# Patient Record
Sex: Female | Born: 1945 | Race: White | Hispanic: No | Marital: Married | Smoking: Never smoker
Health system: Southern US, Community
[De-identification: ages and names within clinical notes are randomized; demographics above are authoritative.]

## PROBLEM LIST (undated history)

## (undated) DIAGNOSIS — R519 Headache, unspecified: Secondary | ICD-10-CM

## (undated) DIAGNOSIS — E119 Type 2 diabetes mellitus without complications: Secondary | ICD-10-CM

## (undated) DIAGNOSIS — Z8719 Personal history of other diseases of the digestive system: Secondary | ICD-10-CM

## (undated) DIAGNOSIS — R51 Headache: Secondary | ICD-10-CM

## (undated) DIAGNOSIS — R1083 Colic: Secondary | ICD-10-CM

## (undated) DIAGNOSIS — K8020 Calculus of gallbladder without cholecystitis without obstruction: Secondary | ICD-10-CM

## (undated) HISTORY — PX: APPENDECTOMY: SHX54

## (undated) SURGERY — Surgical Case
Anesthesia: *Unknown

---

## 2013-09-20 DIAGNOSIS — R51 Headache: Secondary | ICD-10-CM | POA: Diagnosis not present

## 2013-09-20 DIAGNOSIS — A6004 Herpesviral vulvovaginitis: Secondary | ICD-10-CM | POA: Diagnosis not present

## 2013-09-20 DIAGNOSIS — N760 Acute vaginitis: Secondary | ICD-10-CM | POA: Diagnosis not present

## 2013-09-20 DIAGNOSIS — E119 Type 2 diabetes mellitus without complications: Secondary | ICD-10-CM | POA: Diagnosis not present

## 2013-10-04 DIAGNOSIS — IMO0001 Reserved for inherently not codable concepts without codable children: Secondary | ICD-10-CM | POA: Diagnosis not present

## 2013-10-14 DIAGNOSIS — N760 Acute vaginitis: Secondary | ICD-10-CM | POA: Diagnosis not present

## 2013-10-14 DIAGNOSIS — Z124 Encounter for screening for malignant neoplasm of cervix: Secondary | ICD-10-CM | POA: Diagnosis not present

## 2013-10-14 DIAGNOSIS — E119 Type 2 diabetes mellitus without complications: Secondary | ICD-10-CM | POA: Diagnosis not present

## 2013-11-01 DIAGNOSIS — E1149 Type 2 diabetes mellitus with other diabetic neurological complication: Secondary | ICD-10-CM | POA: Diagnosis not present

## 2014-01-03 DIAGNOSIS — Z9181 History of falling: Secondary | ICD-10-CM | POA: Diagnosis not present

## 2014-01-03 DIAGNOSIS — E1140 Type 2 diabetes mellitus with diabetic neuropathy, unspecified: Secondary | ICD-10-CM | POA: Diagnosis not present

## 2014-01-03 DIAGNOSIS — Z1389 Encounter for screening for other disorder: Secondary | ICD-10-CM | POA: Diagnosis not present

## 2014-01-03 DIAGNOSIS — Z6826 Body mass index (BMI) 26.0-26.9, adult: Secondary | ICD-10-CM | POA: Diagnosis not present

## 2014-01-03 DIAGNOSIS — I10 Essential (primary) hypertension: Secondary | ICD-10-CM | POA: Diagnosis not present

## 2014-01-03 DIAGNOSIS — Z1231 Encounter for screening mammogram for malignant neoplasm of breast: Secondary | ICD-10-CM | POA: Diagnosis not present

## 2014-01-03 DIAGNOSIS — E785 Hyperlipidemia, unspecified: Secondary | ICD-10-CM | POA: Diagnosis not present

## 2014-01-13 DIAGNOSIS — E119 Type 2 diabetes mellitus without complications: Secondary | ICD-10-CM | POA: Diagnosis not present

## 2014-01-13 DIAGNOSIS — H524 Presbyopia: Secondary | ICD-10-CM | POA: Diagnosis not present

## 2014-08-24 DIAGNOSIS — J069 Acute upper respiratory infection, unspecified: Secondary | ICD-10-CM | POA: Diagnosis not present

## 2014-08-24 DIAGNOSIS — J209 Acute bronchitis, unspecified: Secondary | ICD-10-CM | POA: Diagnosis not present

## 2014-11-24 DIAGNOSIS — Z9181 History of falling: Secondary | ICD-10-CM | POA: Diagnosis not present

## 2014-11-24 DIAGNOSIS — E1140 Type 2 diabetes mellitus with diabetic neuropathy, unspecified: Secondary | ICD-10-CM | POA: Diagnosis not present

## 2014-11-24 DIAGNOSIS — Z6823 Body mass index (BMI) 23.0-23.9, adult: Secondary | ICD-10-CM | POA: Diagnosis not present

## 2014-11-24 DIAGNOSIS — E785 Hyperlipidemia, unspecified: Secondary | ICD-10-CM | POA: Diagnosis not present

## 2014-11-24 DIAGNOSIS — I10 Essential (primary) hypertension: Secondary | ICD-10-CM | POA: Diagnosis not present

## 2014-11-24 DIAGNOSIS — Z1389 Encounter for screening for other disorder: Secondary | ICD-10-CM | POA: Diagnosis not present

## 2014-11-25 DIAGNOSIS — E1140 Type 2 diabetes mellitus with diabetic neuropathy, unspecified: Secondary | ICD-10-CM | POA: Diagnosis not present

## 2015-05-31 DIAGNOSIS — E785 Hyperlipidemia, unspecified: Secondary | ICD-10-CM | POA: Diagnosis not present

## 2015-05-31 DIAGNOSIS — E1140 Type 2 diabetes mellitus with diabetic neuropathy, unspecified: Secondary | ICD-10-CM | POA: Diagnosis not present

## 2015-05-31 DIAGNOSIS — Z6824 Body mass index (BMI) 24.0-24.9, adult: Secondary | ICD-10-CM | POA: Diagnosis not present

## 2015-05-31 DIAGNOSIS — I10 Essential (primary) hypertension: Secondary | ICD-10-CM | POA: Diagnosis not present

## 2015-06-07 DIAGNOSIS — Z6824 Body mass index (BMI) 24.0-24.9, adult: Secondary | ICD-10-CM | POA: Diagnosis not present

## 2015-06-07 DIAGNOSIS — J101 Influenza due to other identified influenza virus with other respiratory manifestations: Secondary | ICD-10-CM | POA: Diagnosis not present

## 2015-06-07 DIAGNOSIS — J029 Acute pharyngitis, unspecified: Secondary | ICD-10-CM | POA: Diagnosis not present

## 2015-06-29 DIAGNOSIS — E119 Type 2 diabetes mellitus without complications: Secondary | ICD-10-CM | POA: Diagnosis not present

## 2015-06-29 DIAGNOSIS — H2513 Age-related nuclear cataract, bilateral: Secondary | ICD-10-CM | POA: Diagnosis not present

## 2015-07-05 DIAGNOSIS — J309 Allergic rhinitis, unspecified: Secondary | ICD-10-CM | POA: Diagnosis not present

## 2015-07-05 DIAGNOSIS — J208 Acute bronchitis due to other specified organisms: Secondary | ICD-10-CM | POA: Diagnosis not present

## 2015-12-06 DIAGNOSIS — Z9181 History of falling: Secondary | ICD-10-CM | POA: Diagnosis not present

## 2015-12-06 DIAGNOSIS — E785 Hyperlipidemia, unspecified: Secondary | ICD-10-CM | POA: Diagnosis not present

## 2015-12-06 DIAGNOSIS — I10 Essential (primary) hypertension: Secondary | ICD-10-CM | POA: Diagnosis not present

## 2015-12-06 DIAGNOSIS — E1140 Type 2 diabetes mellitus with diabetic neuropathy, unspecified: Secondary | ICD-10-CM | POA: Diagnosis not present

## 2015-12-06 DIAGNOSIS — Z6824 Body mass index (BMI) 24.0-24.9, adult: Secondary | ICD-10-CM | POA: Diagnosis not present

## 2015-12-06 DIAGNOSIS — Z1389 Encounter for screening for other disorder: Secondary | ICD-10-CM | POA: Diagnosis not present

## 2016-01-20 DIAGNOSIS — R1013 Epigastric pain: Secondary | ICD-10-CM | POA: Diagnosis not present

## 2016-01-20 DIAGNOSIS — K8020 Calculus of gallbladder without cholecystitis without obstruction: Secondary | ICD-10-CM | POA: Diagnosis not present

## 2016-04-02 DIAGNOSIS — R1013 Epigastric pain: Secondary | ICD-10-CM | POA: Diagnosis not present

## 2016-04-02 DIAGNOSIS — K3189 Other diseases of stomach and duodenum: Secondary | ICD-10-CM | POA: Diagnosis not present

## 2016-04-02 DIAGNOSIS — R1011 Right upper quadrant pain: Secondary | ICD-10-CM | POA: Diagnosis not present

## 2016-04-02 DIAGNOSIS — R109 Unspecified abdominal pain: Secondary | ICD-10-CM | POA: Diagnosis not present

## 2016-04-02 DIAGNOSIS — K8020 Calculus of gallbladder without cholecystitis without obstruction: Secondary | ICD-10-CM | POA: Diagnosis not present

## 2016-04-03 ENCOUNTER — Inpatient Hospital Stay (HOSPITAL_COMMUNITY)
Admission: AD | Admit: 2016-04-03 | Discharge: 2016-04-05 | DRG: 328 | Disposition: A | Discharge status: Home / Self Care | Payer: Medicare Other | Source: Other Acute Inpatient Hospital | Attending: General Surgery | Admitting: General Surgery

## 2016-04-03 ENCOUNTER — Encounter (HOSPITAL_COMMUNITY)
Admission: AD | Disposition: A | Discharge status: Home / Self Care | Payer: Self-pay | Source: Other Acute Inpatient Hospital

## 2016-04-03 ENCOUNTER — Inpatient Hospital Stay (HOSPITAL_COMMUNITY): Payer: Medicare Other | Admitting: Certified Registered Nurse Anesthetist

## 2016-04-03 ENCOUNTER — Encounter (HOSPITAL_COMMUNITY): Payer: Self-pay | Admitting: *Deleted

## 2016-04-03 DIAGNOSIS — Z9889 Other specified postprocedural states: Secondary | ICD-10-CM

## 2016-04-03 DIAGNOSIS — R112 Nausea with vomiting, unspecified: Secondary | ICD-10-CM | POA: Diagnosis not present

## 2016-04-03 DIAGNOSIS — K449 Diaphragmatic hernia without obstruction or gangrene: Secondary | ICD-10-CM

## 2016-04-03 DIAGNOSIS — K440 Diaphragmatic hernia with obstruction, without gangrene: Secondary | ICD-10-CM | POA: Diagnosis not present

## 2016-04-03 DIAGNOSIS — E119 Type 2 diabetes mellitus without complications: Secondary | ICD-10-CM | POA: Diagnosis not present

## 2016-04-03 HISTORY — DX: Colic: R10.83

## 2016-04-03 HISTORY — PX: LAPAROSCOPIC NISSEN FUNDOPLICATION: SHX1932

## 2016-04-03 HISTORY — DX: Personal history of other diseases of the digestive system: Z87.19

## 2016-04-03 HISTORY — DX: Type 2 diabetes mellitus without complications: E11.9

## 2016-04-03 HISTORY — DX: Headache, unspecified: R51.9

## 2016-04-03 HISTORY — DX: Calculus of gallbladder without cholecystitis without obstruction: K80.20

## 2016-04-03 HISTORY — DX: Headache: R51

## 2016-04-03 HISTORY — PX: HIATAL HERNIA REPAIR: SHX195

## 2016-04-03 LAB — CBC
HCT: 40.6 % (ref 36.0–46.0)
HEMOGLOBIN: 13.2 g/dL (ref 12.0–15.0)
MCH: 29.1 pg (ref 26.0–34.0)
MCHC: 32.5 g/dL (ref 30.0–36.0)
MCV: 89.4 fL (ref 78.0–100.0)
PLATELETS: 240 10*3/uL (ref 150–400)
RBC: 4.54 MIL/uL (ref 3.87–5.11)
RDW: 13.1 % (ref 11.5–15.5)
WBC: 12.2 10*3/uL — ABNORMAL HIGH (ref 4.0–10.5)

## 2016-04-03 LAB — BASIC METABOLIC PANEL
ANION GAP: 8 (ref 5–15)
BUN: 9 mg/dL (ref 6–20)
CALCIUM: 9 mg/dL (ref 8.9–10.3)
CO2: 29 mmol/L (ref 22–32)
CREATININE: 1.13 mg/dL — AB (ref 0.44–1.00)
Chloride: 102 mmol/L (ref 101–111)
GFR calc Af Amer: 56 mL/min — ABNORMAL LOW (ref >60)
GFR, EST NON AFRICAN AMERICAN: 48 mL/min — AB (ref >60)
Glucose, Bld: 223 mg/dL — ABNORMAL HIGH (ref 65–99)
Potassium: 4.4 mmol/L (ref 3.5–5.1)
Sodium: 139 mmol/L (ref 135–145)

## 2016-04-03 LAB — GLUCOSE, CAPILLARY
GLUCOSE-CAPILLARY: 162 mg/dL — AB (ref 65–99)
GLUCOSE-CAPILLARY: 100 mg/dL — AB (ref 65–99)
Glucose-Capillary: 178 mg/dL — ABNORMAL HIGH (ref 65–99)
Glucose-Capillary: 171 mg/dL — ABNORMAL HIGH (ref 65–99)

## 2016-04-03 LAB — MRSA PCR SCREENING: MRSA BY PCR: NEGATIVE

## 2016-04-03 SURGERY — REPAIR, HERNIA, HIATAL, LAPAROSCOPIC
Anesthesia: General | Site: Abdomen

## 2016-04-03 MED ORDER — SUCCINYLCHOLINE 20MG/ML (10ML) SYRINGE FOR MEDFUSION PUMP - OPTIME
INTRAMUSCULAR | Status: DC | PRN
  Administered 2016-04-03: 120 mg via INTRAVENOUS

## 2016-04-03 MED ORDER — SUCCINYLCHOLINE CHLORIDE 200 MG/10ML IV SOSY
PREFILLED_SYRINGE | INTRAVENOUS | Status: AC
  Filled 2016-04-03: qty 20

## 2016-04-03 MED ORDER — ONDANSETRON HCL 4 MG/2ML IJ SOLN
INTRAMUSCULAR | Status: DC | PRN
  Administered 2016-04-03: 4 mg via INTRAVENOUS

## 2016-04-03 MED ORDER — ONDANSETRON HCL 4 MG/2ML IJ SOLN
INTRAMUSCULAR | Status: AC
  Filled 2016-04-03: qty 4

## 2016-04-03 MED ORDER — SUGAMMADEX SODIUM 200 MG/2ML IV SOLN
INTRAVENOUS | Status: AC
  Filled 2016-04-03: qty 2

## 2016-04-03 MED ORDER — DIPHENHYDRAMINE HCL 50 MG/ML IJ SOLN
INTRAMUSCULAR | Status: AC
  Filled 2016-04-03: qty 1

## 2016-04-03 MED ORDER — PHENYLEPHRINE 40 MCG/ML (10ML) SYRINGE FOR IV PUSH (FOR BLOOD PRESSURE SUPPORT)
PREFILLED_SYRINGE | INTRAVENOUS | Status: AC
  Filled 2016-04-03: qty 20

## 2016-04-03 MED ORDER — BUPIVACAINE HCL 0.25 % IJ SOLN
INTRAMUSCULAR | Status: DC | PRN
  Administered 2016-04-03: 7 mL

## 2016-04-03 MED ORDER — ROCURONIUM BROMIDE 100 MG/10ML IV SOLN
INTRAVENOUS | Status: DC | PRN
  Administered 2016-04-03: 40 mg via INTRAVENOUS
  Administered 2016-04-03 (×2): 10 mg via INTRAVENOUS
  Administered 2016-04-03: 20 mg via INTRAVENOUS

## 2016-04-03 MED ORDER — LIDOCAINE 2% (20 MG/ML) 5 ML SYRINGE
INTRAMUSCULAR | Status: AC
  Filled 2016-04-03: qty 15

## 2016-04-03 MED ORDER — PHENYLEPHRINE HCL 10 MG/ML IJ SOLN
INTRAMUSCULAR | Status: DC | PRN
  Administered 2016-04-03: 40 ug via INTRAVENOUS

## 2016-04-03 MED ORDER — CEFAZOLIN SODIUM 1 G IJ SOLR
INTRAMUSCULAR | Status: AC
  Filled 2016-04-03: qty 20

## 2016-04-03 MED ORDER — POTASSIUM CHLORIDE IN NACL 20-0.9 MEQ/L-% IV SOLN
INTRAVENOUS | Status: DC
  Administered 2016-04-03: 100 mL/h via INTRAVENOUS
  Administered 2016-04-04: 22:00:00 via INTRAVENOUS
  Filled 2016-04-03 (×4): qty 1000

## 2016-04-03 MED ORDER — ENOXAPARIN SODIUM 40 MG/0.4ML ~~LOC~~ SOLN: 40.0000 mg | Freq: Every day | SUBCUTANEOUS | Status: DC

## 2016-04-03 MED ORDER — 0.9 % SODIUM CHLORIDE (POUR BTL) OPTIME
TOPICAL | Status: DC | PRN
Start: 2016-04-03 — End: 2016-04-03
  Administered 2016-04-03 (×2): 1000 mL

## 2016-04-03 MED ORDER — ROCURONIUM BROMIDE 50 MG/5ML IV SOSY
PREFILLED_SYRINGE | INTRAVENOUS | Status: AC
  Filled 2016-04-03: qty 25

## 2016-04-03 MED ORDER — ENOXAPARIN SODIUM 40 MG/0.4ML ~~LOC~~ SOLN
40.0000 mg | SUBCUTANEOUS | Status: DC
  Administered 2016-04-04 – 2016-04-05 (×2): 40 mg via SUBCUTANEOUS
  Filled 2016-04-03 (×2): qty 0.4

## 2016-04-03 MED ORDER — DEXAMETHASONE SODIUM PHOSPHATE 10 MG/ML IJ SOLN
INTRAMUSCULAR | Status: AC
  Filled 2016-04-03: qty 1

## 2016-04-03 MED ORDER — LACTATED RINGERS IV SOLN
INTRAVENOUS | Status: DC
  Administered 2016-04-03 (×2): via INTRAVENOUS

## 2016-04-03 MED ORDER — DEXTROSE-NACL 5-0.9 % IV SOLN
INTRAVENOUS | Status: DC
  Administered 2016-04-04 (×2): via INTRAVENOUS

## 2016-04-03 MED ORDER — MORPHINE SULFATE (PF) 2 MG/ML IV SOLN
2.0000 mg | INTRAVENOUS | Status: DC | PRN
  Administered 2016-04-03 (×2): 2 mg via INTRAVENOUS
  Filled 2016-04-03 (×2): qty 1

## 2016-04-03 MED ORDER — LIDOCAINE HCL (CARDIAC) 20 MG/ML IV SOLN
INTRAVENOUS | Status: DC | PRN
  Administered 2016-04-03: 100 mg via INTRAVENOUS

## 2016-04-03 MED ORDER — PROMETHAZINE HCL 25 MG/ML IJ SOLN
12.5000 mg | Freq: Three times a day (TID) | INTRAMUSCULAR | Status: DC | PRN
  Administered 2016-04-03: 12.5 mg via INTRAVENOUS
  Filled 2016-04-03: qty 1

## 2016-04-03 MED ORDER — BUPIVACAINE HCL (PF) 0.25 % IJ SOLN
INTRAMUSCULAR | Status: AC
  Filled 2016-04-03: qty 30

## 2016-04-03 MED ORDER — LABETALOL HCL 5 MG/ML IV SOLN
INTRAVENOUS | Status: DC | PRN
  Administered 2016-04-03: 10 mg via INTRAVENOUS
  Administered 2016-04-03 (×2): 5 mg via INTRAVENOUS

## 2016-04-03 MED ORDER — SODIUM CHLORIDE 0.9 % IR SOLN
Status: DC | PRN
  Administered 2016-04-03: 1000 mL

## 2016-04-03 MED ORDER — HYDROMORPHONE HCL 2 MG/ML IJ SOLN
1.0000 mg | INTRAMUSCULAR | Status: DC | PRN
  Administered 2016-04-03: 2 mg via INTRAVENOUS
  Administered 2016-04-04: 1 mg via INTRAVENOUS
  Filled 2016-04-03 (×2): qty 1

## 2016-04-03 MED ORDER — FENTANYL CITRATE (PF) 100 MCG/2ML IJ SOLN
INTRAMUSCULAR | Status: AC
  Filled 2016-04-03: qty 4

## 2016-04-03 MED ORDER — HYDROMORPHONE HCL 1 MG/ML IJ SOLN: 0.2500 mg | INTRAMUSCULAR | Status: DC | PRN

## 2016-04-03 MED ORDER — PROMETHAZINE HCL 25 MG/ML IJ SOLN: 6.2500 mg | INTRAMUSCULAR | Status: DC | PRN

## 2016-04-03 MED ORDER — ZOLPIDEM TARTRATE 5 MG PO TABS: 5.0000 mg | ORAL_TABLET | Freq: Every evening | ORAL | Status: DC | PRN

## 2016-04-03 MED ORDER — PROPOFOL 10 MG/ML IV BOLUS
INTRAVENOUS | Status: DC | PRN
  Administered 2016-04-03: 150 mg via INTRAVENOUS

## 2016-04-03 MED ORDER — MIDAZOLAM HCL 5 MG/5ML IJ SOLN
INTRAMUSCULAR | Status: DC | PRN
  Administered 2016-04-03: 2 mg via INTRAVENOUS

## 2016-04-03 MED ORDER — PANTOPRAZOLE SODIUM 40 MG IV SOLR
40.0000 mg | Freq: Every day | INTRAVENOUS | Status: DC
  Administered 2016-04-03 – 2016-04-04 (×3): 40 mg via INTRAVENOUS
  Filled 2016-04-03 (×3): qty 40

## 2016-04-03 MED ORDER — MIDAZOLAM HCL 2 MG/2ML IJ SOLN
INTRAMUSCULAR | Status: AC
  Filled 2016-04-03: qty 2

## 2016-04-03 MED ORDER — ONDANSETRON HCL 4 MG/2ML IJ SOLN: 4.0000 mg | Freq: Four times a day (QID) | INTRAMUSCULAR | Status: DC | PRN

## 2016-04-03 MED ORDER — LABETALOL HCL 5 MG/ML IV SOLN
INTRAVENOUS | Status: AC
  Filled 2016-04-03: qty 4

## 2016-04-03 MED ORDER — FENTANYL CITRATE (PF) 100 MCG/2ML IJ SOLN
INTRAMUSCULAR | Status: DC | PRN
  Administered 2016-04-03: 50 ug via INTRAVENOUS
  Administered 2016-04-03: 25 ug via INTRAVENOUS
  Administered 2016-04-03 (×4): 50 ug via INTRAVENOUS
  Administered 2016-04-03: 25 ug via INTRAVENOUS

## 2016-04-03 MED ORDER — CEFAZOLIN SODIUM 1 G IJ SOLR
INTRAMUSCULAR | Status: DC | PRN
  Administered 2016-04-03: 2 g via INTRAMUSCULAR

## 2016-04-03 MED ORDER — SCOPOLAMINE 1 MG/3DAYS TD PT72
MEDICATED_PATCH | TRANSDERMAL | Status: AC
  Filled 2016-04-03: qty 2

## 2016-04-03 MED ORDER — SUGAMMADEX SODIUM 200 MG/2ML IV SOLN
INTRAVENOUS | Status: DC | PRN
  Administered 2016-04-03: 200 mg via INTRAVENOUS

## 2016-04-03 MED ORDER — ONDANSETRON 4 MG PO TBDP: 4.0000 mg | ORAL_TABLET | Freq: Four times a day (QID) | ORAL | Status: DC | PRN

## 2016-04-03 MED ORDER — WHITE PETROLATUM GEL
Status: AC
  Filled 2016-04-03: qty 1

## 2016-04-03 MED ORDER — FENTANYL CITRATE (PF) 100 MCG/2ML IJ SOLN
INTRAMUSCULAR | Status: AC
  Filled 2016-04-03: qty 2

## 2016-04-03 SURGICAL SUPPLY — 48 items
APPLIER CLIP ROT 10 11.4 M/L (STAPLE)
BENZOIN TINCTURE PRP APPL 2/3 (GAUZE/BANDAGES/DRESSINGS) ×3 IMPLANT
CANISTER SUCTION 2500CC (MISCELLANEOUS) ×3 IMPLANT
CLAMP ENDO BABCK 10MM (STAPLE) ×3 IMPLANT
CLIP APPLIE ROT 10 11.4 M/L (STAPLE) IMPLANT
CLOSURE WOUND 1/2 X4 (GAUZE/BANDAGES/DRESSINGS) ×1
COVER SURGICAL LIGHT HANDLE (MISCELLANEOUS) ×3 IMPLANT
DECANTER SPIKE VIAL GLASS SM (MISCELLANEOUS) ×3 IMPLANT
DEVICE SUTURE ENDOST 10MM (ENDOMECHANICALS) ×3 IMPLANT
DEVICE TROCAR PUNCTURE CLOSURE (ENDOMECHANICALS) ×3 IMPLANT
DISSECTOR BLUNT TIP ENDO 5MM (MISCELLANEOUS) IMPLANT
DRAIN PENROSE 1/2X36 STERILE (WOUND CARE) ×3 IMPLANT
DRAPE LAPAROSCOPIC ABDOMINAL (DRAPES) ×6 IMPLANT
ELECT REM PT RETURN 9FT ADLT (ELECTROSURGICAL) ×3
ELECTRODE REM PT RTRN 9FT ADLT (ELECTROSURGICAL) ×1 IMPLANT
GAUZE SPONGE 2X2 8PLY STRL LF (GAUZE/BANDAGES/DRESSINGS) ×1 IMPLANT
GLOVE BIOGEL M 8.0 STRL (GLOVE) ×3 IMPLANT
GOWN STRL REUS W/ TWL LRG LVL3 (GOWN DISPOSABLE) ×1 IMPLANT
GOWN STRL REUS W/ TWL XL LVL3 (GOWN DISPOSABLE) ×1 IMPLANT
GOWN STRL REUS W/TWL LRG LVL3 (GOWN DISPOSABLE) ×2
GOWN STRL REUS W/TWL XL LVL3 (GOWN DISPOSABLE) ×2
KIT BASIN OR (CUSTOM PROCEDURE TRAY) ×3 IMPLANT
MESH HERNIA 7X10 (Mesh General) ×3 IMPLANT
NEEDLE INSUFFLATION 14GA 120MM (NEEDLE) ×3 IMPLANT
NS IRRIG 1000ML POUR BTL (IV SOLUTION) ×3 IMPLANT
PENCIL BUTTON HOLSTER BLD 10FT (ELECTRODE) IMPLANT
SCALPEL HARMONIC ACE (MISCELLANEOUS) ×3 IMPLANT
SCISSORS LAP 5X35 DISP (ENDOMECHANICALS) ×3 IMPLANT
SET IRRIG TUBING LAPAROSCOPIC (IRRIGATION / IRRIGATOR) ×3 IMPLANT
SLEEVE ADV FIXATION 5X100MM (TROCAR) IMPLANT
SPONGE GAUZE 2X2 STER 10/PKG (GAUZE/BANDAGES/DRESSINGS) ×2
STAPLER AUT SUT 4.8 EEAXL 31 (STAPLE) ×3 IMPLANT
STAPLER VISISTAT 35W (STAPLE) ×3 IMPLANT
STRIP CLOSURE SKIN 1/2X4 (GAUZE/BANDAGES/DRESSINGS) ×2 IMPLANT
SUT ETHIBOND CT1 BRD #0 30IN (SUTURE) ×21 IMPLANT
SUT MNCRL AB 4-0 PS2 18 (SUTURE) ×3 IMPLANT
SUT SURGIDAC NAB ES-9 0 48 120 (SUTURE) IMPLANT
TIP INNERVISION DETACH 40FR (MISCELLANEOUS) IMPLANT
TIP INNERVISION DETACH 50FR (MISCELLANEOUS) IMPLANT
TIP INNERVISION DETACH 56FR (MISCELLANEOUS) IMPLANT
TIPS INNERVISION DETACH 40FR (MISCELLANEOUS)
TRAY FOLEY CATH 14FRSI W/METER (CATHETERS) ×3 IMPLANT
TRAY LAPAROSCOPIC MC (CUSTOM PROCEDURE TRAY) ×3 IMPLANT
TROCAR ADV FIXATION 5X100MM (TROCAR) IMPLANT
TROCAR XCEL BLUNT TIP 100MML (ENDOMECHANICALS) IMPLANT
TROCAR XCEL NON-BLD 11X100MML (ENDOMECHANICALS) IMPLANT
TROCAR XCEL NON-BLD 5MMX100MML (ENDOMECHANICALS) IMPLANT
TUBING FILTER THERMOFLATOR (ELECTROSURGICAL) ×3 IMPLANT

## 2016-04-03 NOTE — Progress Notes (Signed)
Inpatient Diabetes Program Recommendations  AACE/ADA: New Consensus Statement on Inpatient Glycemic Control (2015)  Target Ranges:  Prepandial:   less than 140 mg/dL      Peak postprandial:   less than 180 mg/dL (1-2 hours)      Critically ill patients:  140 - 180 mg/dL   Review of Glycemic Control  Diabetes history: DM 2 Outpatient Diabetes medications: Metformin 1000 mg Daily prn Current orders for Inpatient glycemic control: None   Inpatient Diabetes Program Recommendations:   In OR currently. Fasting glucose 100 while NPO this am. When patient returns to the floor, please consider Novolog Sensitive Correction Q 4 hours while inpatient.  Thanks,  Christena DeemShannon  RN, MSN, [facility] Inpatient Diabetes Coordinator Team Pager [phone] (8a-5p)

## 2016-04-03 NOTE — Progress Notes (Signed)
  Subjective: Pt with no c/o this AM No abd pain No n/v  Objective: Vital signs in last 24 hours: Temp:  [98.1 F (36.7 C)-98.6 F (37 C)] 98.2 F (36.8 C) (01/22 0700) Pulse Rate:  [76-94] 94 (01/22 0400) Resp:  [15-31] 22 (01/22 0400) BP: (153-191)/(67-95) 158/73 (01/22 0400) SpO2:  [94 %-97 %] 96 % (01/22 0400) Weight:  [72.7 kg (160 lb 4.4 oz)] 72.7 kg (160 lb 4.4 oz) (01/22 0025) Last BM Date: 04/01/16  Intake/Output from previous day: 01/21 0701 - 01/22 0700 In: 576.7 [I.V.:576.7] Out: 200 [Urine:200] Intake/Output this shift: No intake/output data recorded.  General appearance: alert and cooperative Resp: clear to auscultation bilaterally Cardio: regular rate and rhythm, S1, S2 normal, no murmur, click, rub or gallop GI: soft, non-tender; bowel sounds normal; no masses,  no organomegaly  Lab Results:   Recent Labs  04/03/16 0105  WBC 12.2*  HGB 13.2  HCT 40.6  PLT 240   BMET  Recent Labs  04/03/16 0105  NA 139  K 4.4  CL 102  CO2 29  GLUCOSE 223*  BUN 9  CREATININE 1.13*  CALCIUM 9.0    Assessment/Plan: Type III paraesophageal hernia DM   1. Pt would like to proceed to the OR for Lap hiatal hernia repair with mesh and nissen fundoplication 2. All risks and benefits were discussed with the patient, to generally include infection, bleeding, damage to surrounding structures ie. esophagus, pneumothorax, acute and chronic nerve pain, and recurrence. Alternatives were offered and described.  All questions were answered and the patient voiced understanding of the procedure and wishes to proceed at this point.   LOS: 0 days    Marigene EhlersRamirez Jr., Jed LimerickArmando 04/03/2016

## 2016-04-03 NOTE — Discharge Instructions (Signed)
EATING AFTER YOUR ESOPHAGEAL SURGERY °(Stomach Fundoplication, Hiatal Hernia repair, Achalasia surgery, etc) ° °###################################################################### ° °EAT °Start with a pureed / full liquid diet (see below) °Gradually transition to a high fiber diet with a fiber supplement over the next month after discharge.   ° °WALK °Walk an hour a day.  Control your pain to do that.   ° °CONTROL PAIN °Control pain so that you can walk, sleep, tolerate sneezing/coughing, go up/down stairs. ° °HAVE A BOWEL MOVEMENT DAILY °Keep your bowels regular to avoid problems.  OK to try a laxative to override constipation.  OK to use an antidairrheal to slow down diarrhea.  Call if not better after 2 tries ° °CALL IF YOU HAVE PROBLEMS/CONCERNS °Call if you are still struggling despite following these instructions. °Call if you have concerns not answered by these instructions ° °###################################################################### ° ° °After your esophageal surgery, expect some sticking with swallowing over the next 1-2 months.   ° °If food sticks when you eat, it is called "dysphagia".  This is due to swelling around your esophagus at the wrap & hiatal diaphragm repair.  It will gradually ease off over the next few months.  To help you through this temporary phase, we start you out on a pureed (blenderized) diet. ° °Your first meal in the hospital was thin liquids.  You should have been given a pureed diet by the time you left the hospital.  We ask patients to stay on a pureed diet for the first 2-3 weeks to avoid anything getting "stuck" near your recent surgery.  Don't be alarmed if your ability to swallow doesn't progress according to this plan.  Everyone is different and some diets can advance more or less quickly.   ° ° °Some BASIC RULES to follow are: °· Maintain an upright position whenever eating or drinking. °· Take small bites - just a teaspoon size bite at a time. °· Eat slowly.   It may also help to eat only one food at a time. °· Consider nibbling through smaller, more frequent meals & avoid the urge to eat BIG meals °· Do not push through feelings of fullness, nausea, or bloatedness °· Do not mix solid foods and liquids in the same mouthful °· Try not to "wash foods down" with large gulps of liquids. °· Avoid carbonated (bubbly/fizzy) drinks.   °· Avoid foods that make you feel gassy or bloated.  Start with bland foods first.  Wait on trying greasy, fried, or spicy meals until you are tolerating more bland solids well. °· Understand that it will be hard to burp and belch at first.  This gradually improves with time.  Expect to be more gassy/flatulent/bloated initially.  Walking will help your body manage it better. °· Consider using medications for bloating that contain simethicone such as  Maalox or Gas-X  °· Eat in a relaxed atmosphere & minimize distractions. °· Avoid talking while eating.   °· Do not use straws. °· Following each meal, sit in an upright position (90 degree angle) for 60 to 90 minutes.  Going for a short walk can help as well °· If food does stick, don't panic.  Try to relax and let the food pass on its own.  Sipping WARM LIQUID such as strong hot black tea can also help slide it down. ° ° °Be gradual in changes & use common sense: ° °-If you easily tolerating a certain "level" of foods, advance to the next level gradually °-If you are   having trouble swallowing a particular food, then avoid it.   °-If food is sticking when you advance your diet, go back to thinner previous diet (the lower LEVEL) for 1-2 days. ° °LEVEL 1 = PUREED DIET ° °Do for the first 2 WEEKS AFTER SURGERY ° °-Foods in this group are pureed or blenderized to a smooth, mashed potato-like consistency.  °-If necessary, the pureed foods can keep their shape with the addition of a thickening agent.   °-Meat should be pureed to a smooth, pasty consistency.  Hot broth or gravy may be added to the pureed  meat, approximately 1 oz. of liquid per 3 oz. serving of meat. °-CAUTION:  If any foods do not puree into a smooth consistency, swallowing will be more difficult.  (For example, nuts or seeds sometimes do not blend well.) ° °Hot Foods Cold Foods  °Pureed scrambled eggs and cheese Pureed cottage cheese  °Baby cereals Thickened juices and nectars  °Thinned cooked cereals (no lumps) Thickened milk or eggnog  °Pureed French toast or pancakes Ensure  °Mashed potatoes Ice cream  °Pureed parsley, au gratin, scalloped potatoes, candied sweet potatoes Fruit or Italian ice, sherbet  °Pureed buttered or alfredo noodles Plain yogurt  °Pureed vegetables (no corn or peas) Instant breakfast  °Pureed soups and creamed soups Smooth pudding, mousse, custard  °Pureed scalloped apples Whipped gelatin  °Gravies Sugar, syrup, honey, jelly  °Sauces, cheese, tomato, barbecue, white, creamed Cream  °Any baby food Creamer  °Alcohol in moderation (not beer or champagne) Margarine  °Coffee or tea Mayonnaise  ° Ketchup, mustard  ° Apple sauce  ° °SAMPLE MENU:  PUREED DIET °Breakfast Lunch Dinner  °· Orange juice, 1/2 cup °· Cream of wheat, 1/2 cup · Pineapple juice, 1/2 cup · Pureed turkey, barley soup, 3/4 cup °· Pureed Hawaiian chicken, 3 oz  °· Scrambled eggs, mashed or blended with cheese, 1/2 cup °· Tea or coffee, 1 cup  °· Whole milk, 1 cup  °· Non-dairy creamer, 2 Tbsp. · Mashed potatoes, 1/2 cup °· Pureed cooled broccoli, 1/2 cup °· Apple sauce, 1/2 cup °· Coffee or tea · Mashed potatoes, 1/2 cup °· Pureed spinach, 1/2 cup °· Frozen yogurt, 1/2 cup °· Tea or coffee  ° ° ° ° °LEVEL 2 = SOFT DIET ° °After your first 2 weeks, you can advance to a soft diet.   °Keep on this diet until everything goes down easily. ° °Hot Foods Cold Foods  °White fish Cottage cheese  °Stuffed fish Junior baby fruit  °Baby food meals Semi thickened juices  °Minced soft cooked, scrambled, poached eggs nectars  °Souffle & omelets Ripe mashed bananas  °Cooked  cereals Canned fruit, pineapple sauce, milk  °potatoes Milkshake  °Buttered or Alfredo noodles Custard  °Cooked cooled vegetable Puddings, including tapioca  °Sherbet Yogurt  °Vegetable soup or alphabet soup Fruit ice, Italian ice  °Gravies Whipped gelatin  °Sugar, syrup, honey, jelly Junior baby desserts  °Sauces:  Cheese, creamed, barbecue, tomato, white Cream  °Coffee or tea Margarine  ° °SAMPLE MENU:  LEVEL 2 °Breakfast Lunch Dinner  °· Orange juice, 1/2 cup °· Oatmeal, 1/2 cup °· Scrambled eggs with cheese, 1/2 cup °· Decaffeinated tea, 1 cup °· Whole milk, 1 cup °· Non-dairy creamer, 2 Tbsp · Pineapple juice, 1/2 cup °· Minced beef, 3 oz °· Gravy, 2 Tbsp °· Mashed potatoes, 1/2 cup °· Minced fresh broccoli, 1/2 cup °· Applesauce, 1/2 cup °· Coffee, 1 cup · Turkey, barley soup, 3/4 cup °·   Minced Hawaiian chicken, 3 oz °· Mashed potatoes, 1/2 cup °· Cooked spinach, 1/2 cup °· Frozen yogurt, 1/2 cup °· Non-dairy creamer, 2 Tbsp  ° ° ° ° °LEVEL 3 = CHOPPED DIET ° °-After all the foods in level 2 (soft diet) are passing through well you should advance up to more chopped foods.  °-It is still important to cut these foods into small pieces and eat slowly. ° °Hot Foods Cold Foods  °Poultry Cottage cheese  °Chopped Swedish meatballs Yogurt  °Meat salads (ground or flaked meat) Milk  °Flaked fish (tuna) Milkshakes  °Poached or scrambled eggs Soft, cold, dry cereal  °Souffles and omelets Fruit juices or nectars  °Cooked cereals Chopped canned fruit  °Chopped French toast or pancakes Canned fruit cocktail  °Noodles or pasta (no rice) Pudding, mousse, custard  °Cooked vegetables (no frozen peas, corn, or mixed vegetables) Green salad  °Canned small sweet peas Ice cream  °Creamed soup or vegetable soup Fruit ice, Italian ice  °Pureed vegetable soup or alphabet soup Non-dairy creamer  °Ground scalloped apples Margarine  °Gravies Mayonnaise  °Sauces:  Cheese, creamed, barbecue, tomato, white Ketchup  °Coffee or tea Mustard   ° °SAMPLE MENU:  LEVEL 3 °Breakfast Lunch Dinner  °· Orange juice, 1/2 cup °· Oatmeal, 1/2 cup °· Scrambled eggs with cheese, 1/2 cup °· Decaffeinated tea, 1 cup °· Whole milk, 1 cup °· Non-dairy creamer, 2 Tbsp °· Ketchup, 1 Tbsp °· Margarine, 1 tsp °· Salt, 1/4 tsp °· Sugar, 2 tsp · Pineapple juice, 1/2 cup °· Ground beef, 3 oz °· Gravy, 2 Tbsp °· Mashed potatoes, 1/2 cup °· Cooked spinach, 1/2 cup °· Applesauce, 1/2 cup °· Decaffeinated coffee °· Whole milk °· Non-dairy creamer, 2 Tbsp °· Margarine, 1 tsp °· Salt, 1/4 tsp · Pureed turkey, barley soup, 3/4 cup °· Barbecue chicken, 3 oz °· Mashed potatoes, 1/2 cup °· Ground fresh broccoli, 1/2 cup °· Frozen yogurt, 1/2 cup °· Decaffeinated tea, 1 cup °· Non-dairy creamer, 2 Tbsp °· Margarine, 1 tsp °· Salt, 1/4 tsp °· Sugar, 1 tsp  ° ° °LEVEL 4:  REGULAR FOODS ° °-Foods in this group are soft, moist, regularly textured foods.   °-This level includes meat and breads, which tend to be the hardest things to swallow.   °-Eat very slowly, chew well and continue to avoid carbonated drinks. °-most people are at this level in 4-6 weeks ° °Hot Foods Cold Foods  °Baked fish or skinned Soft cheeses - cottage cheese  °Souffles and omelets Cream cheese  °Eggs Yogurt  °Stuffed shells Milk  °Spaghetti with meat sauce Milkshakes  °Cooked cereal Cold dry cereals (no nuts, dried fruit, coconut)  °French toast or pancakes Crackers  °Buttered toast Fruit juices or nectars  °Noodles or pasta (no rice) Canned fruit  °Potatoes (all types) Ripe bananas  °Soft, cooked vegetables (no corn, lima, or baked beans) Peeled, ripe, fresh fruit  °Creamed soups or vegetable soup Cakes (no nuts, dried fruit, coconut)  °Canned chicken noodle soup Plain doughnuts  °Gravies Ice cream  °Bacon dressing Pudding, mousse, custard  °Sauces:  Cheese, creamed, barbecue, tomato, white Fruit ice, Italian ice, sherbet  °Decaffeinated tea or coffee Whipped gelatin  °Pork chops Regular gelatin  ° Canned fruited  gelatin molds  ° Sugar, syrup, honey, jam, jelly  ° Cream  ° Non-dairy  ° Margarine  ° Oil  ° Mayonnaise  ° Ketchup  ° Mustard  ° °TROUBLESHOOTING IRREGULAR BOWELS  °1) Avoid extremes of bowel   movements (no bad constipation/diarrhea)  °2) Miralax 17gm mixed in 8oz. water or juice-daily. May use BID as needed.  °3) Gas-x,Phazyme, etc. as needed for gas & bloating.  °4) Soft,bland diet. No spicy,greasy,fried foods.  °5) Prilosec over-the-counter as needed  °6) May hold gluten/wheat products from diet to see if symptoms improve.  °7) May try probiotics (Align, Activa, etc) to help calm the bowels down  °7) If symptoms become worse call back immediately. ° ° ° °If you have any questions please call our office at [facility]: [phone]. ° °

## 2016-04-03 NOTE — Progress Notes (Signed)
[facility] After full report received. Pt assisted to hospital bed and introduced to unit and staff. CHG bath done, MRSA swab sent and new hospital gown applied. Pt attached to unit monitor revealing NSR. Dr Lindie SpruceWyatt paged, at bedside with husband plan of care discussed and all questions answered. Visitation guideline reviewed.

## 2016-04-03 NOTE — Transfer of Care (Signed)
Immediate Anesthesia Transfer of Care Note  Patient: Debra Newman  Procedure(s) Performed: Procedure(s): LAPAROSCOPIC REPAIR OF HIATAL HERNIA WITH MESH (N/A) LAPAROSCOPIC NISSEN FUNDOPLICATION (N/A)  Patient Location: PACU  Anesthesia Type:General  Level of Consciousness: sedated  Airway & Oxygen Therapy: Patient Spontanous Breathing and Patient connected to nasal cannula oxygen  Post-op Assessment: Report given to RN and Post -op Vital signs reviewed and stable  Post vital signs: Reviewed and stable  Last Vitals:  Vitals:   04/03/16 0900 04/03/16 1433  BP:  (!) 182/93  Pulse: 73 81  Resp: 15 18  Temp:  (P) 36.6 C    Last Pain:  Vitals:   04/03/16 0700  TempSrc: Oral  PainSc:       Patients Stated Pain Goal: 2 (04/03/16 0400)  Complications: No apparent anesthesia complications

## 2016-04-03 NOTE — Op Note (Signed)
04/03/2016  3:36 PM  PATIENT:  Debra Newman  71 y.o. female  PRE-OPERATIVE DIAGNOSIS:  HIATAL HERNIA  POST-OPERATIVE DIAGNOSIS:  HIATAL HERNIA  PROCEDURE:  Procedure(s): LAPAROSCOPIC REPAIR OF HIATAL HERNIA WITH MESH (N/A) LAPAROSCOPIC NISSEN FUNDOPLICATION (N/A)  SURGEON:  Surgeon(s) and Role:    * Axel Filler, MD - Primary   ASSISTANTS: Myrtie Soman, RNFA   ANESTHESIA:   local and general  EBL: 25cc Total I/O In: 2050 [I.V.:2050] Out: 900 [Urine:900]  BLOOD ADMINISTERED:none  DRAINS: none   LOCAL MEDICATIONS USED:  BUPIVICAINE   SPECIMEN:  No Specimen  DISPOSITION OF SPECIMEN:  N/A  COUNTS:  YES  TOURNIQUET:  * No tourniquets in log *  DICTATION: .Dragon Dictation The patient was taken back to the operating room and placed in the lithotomy position with bilateral SCDs in place. She was prepped and draped in the usual sterile fashion.  A foley catheter was placed.  A timeout was called and off all facts and antibiotics were confirmed.  A Veress needle technique was used to insufflate the abdomen to 14 mm of mercury. This was done the midclavicular line just lateral to the umbilicus. A 5 mm trocar and camera were then placed intra-abdominally.  There was no injury to any intra-abdominal organs. A 5 mm trocar was then placed in the epigastrium and the midline under direct visualization. There were some inferior umbilical midline omental adhesions that were taken down with sharp dissection.  A 5 mm trocar was then placed in the left subcostal margin, 12mm trocar in the left lower quadrant, and a 5mm umbilicus under direct visualization. At this time a Nathanson liver retractor was then placed to retract the liver. At this time it could be seen that there was a large amount of omentum and stomach within the left chest. This was grasped and brought down to the abdomen.   At this time we began to dissect the hernia sac anteriorly. We dissected this circumferentially  around the esophagus. And up into the anterior mediastinum. We proceeded to dissect the left portion of the esophagus and hiatal hernia sac. there were dense adhesions to the left crus and to the short gastrics.  This brought [place] to the left crus. At this point we retracted the esophagus to the left and dissected the right crus, until the left crus could be seen in the most posterior portion.  A latex Penrose drain was then placed around the esophagus to help with retraction.   At this time we continued to dissect the surrounding adventitial tissue around the esophagus cephalad in the mediastinum. This allowed the stomach to lay within the abdomen with undue tension.  We then began to dissect away the short gastrics on the greater curvature of the stomach. This allowed [place] to release some tension of the stomach to allow [place] to dissect the left crus more appropriately.  At this time we were able to visualize the crus had a large defect. 0 Ethibond stitches were then used in an interrupted fashion to reapproximate the hiatus. This allowed the crus to be reapproximated  without strangulation or narrowing of the esophagus. At this time a piece of Gore Bio-A mesh was placed into the abdomen. This was in placed over the suture repair posterior to the esophagus over the hiatal repair and stapled using the Covedien Universal hernia stapler, with a 4.8 mm load. This allowed the mesh to lay flat against the hiatal repair.  At this time the esophagus was retracted inferiorly.  We proceeded to pass the greater curvature of the stomach posterior to the esophagus. A shoeshine technique was performed. At this time the Nissen fundoplication was sutured using 0 Ethibonds in an interrupted standard fashion approximately 1 cm apart approximately 2-3 cm in length. The middle stitch incorporated a thin layer of the esophagus into the wrap. The wrap laid approximately the 10:00 position.  0Ethibonds were used as collar stitches to  secure the wrap to the hiatus. These were done in interrupted fashion.     At this time the area checked for hemostasis which was excellent.  the 12 mm trocar site was reapproximated using an Endo Close and 0 Vicryl interrupted standard fashion.The pneumoperitoneum was evacuated all trochars were removed. All trocar sites were then reapproximated using a 4-0 Monocryl in a subcuticular fashion. the skin was dressed with a LiquiBand.      PLAN OF CARE: Admit to inpatient   PATIENT DISPOSITION:  PACU - hemodynamically stable.   Delay start of Pharmacological VTE agent (>24hrs) due to surgical blood loss or risk of bleeding: not applicable

## 2016-04-03 NOTE — Anesthesia Preprocedure Evaluation (Signed)
Anesthesia Evaluation  Patient identified by MRN, date of birth, ID band Patient awake    Reviewed: Allergy & Precautions, NPO status , Patient's Chart, lab work & pertinent test results  Airway Mallampati: I  TM Distance: >3 FB Neck ROM: Full    Dental  (+) Teeth Intact, Poor Dentition   Pulmonary neg pulmonary ROS,    breath sounds clear to auscultation       Cardiovascular  Rhythm:Regular Rate:Normal     Neuro/Psych    GI/Hepatic Neg liver ROS, Bowel obstruction   Endo/Other  diabetes, Well Controlled, Type 2  Renal/GU negative Renal ROS     Musculoskeletal   Abdominal   Peds  Hematology   Anesthesia Other Findings   Reproductive/Obstetrics                             Anesthesia Physical Anesthesia Plan  ASA: II and emergent  Anesthesia Plan: General   Post-op Pain Management:    Induction: Intravenous  Airway Management Planned: Oral ETT  Additional Equipment:   Intra-op Plan:   Post-operative Plan: Extubation in OR  Informed Consent: I have reviewed the patients History and Physical, chart, labs and discussed the procedure including the risks, benefits and alternatives for the proposed anesthesia with the patient or authorized representative who has indicated his/her understanding and acceptance.   Dental advisory given  Plan Discussed with: CRNA  Anesthesia Plan Comments:         Anesthesia Quick Evaluation

## 2016-04-03 NOTE — H&P (Signed)
Debra Newman is an 71 y.o. female.   Chief Complaint: Nausea, vomiting and mid chest pain HPI: Has had history of hiatal hernia with prior episodes of nausea and vomiting.  Symptoms usually occur postprandially.  Attacks occur sporadically.  This was the worse.  CT done demonstrating paraesophageal h ernia with possible volvulus.  Clinically she does not appear to have a volvulus.  No past medical history on file.  No past surgical history on file.  No family history on file. Social History:  has no tobacco, alcohol, and drug history on file.  Allergies: Allergies not on file  No prescriptions prior to admission.    No results found for this or any previous visit (from the past 48 hour(s)). No results found.  Review of Systems  Constitutional: Negative for chills and fever.  HENT: Negative.   Eyes: Negative.   Respiratory: Negative.   Cardiovascular: Positive for chest pain (Midsternal discomfort).  Gastrointestinal: Positive for heartburn and vomiting.  Genitourinary: Negative.   Musculoskeletal: Negative.   Skin: Negative.   Neurological: Negative.   Endo/Heme/Allergies: Negative.   Psychiatric/Behavioral: Negative.     Blood pressure (!) 166/69, pulse 77, temperature 98.6 F (37 C), temperature source Oral, resp. rate 19, SpO2 96 %. Physical Exam  Constitutional: She is oriented to person, place, and time. She appears well-developed and well-nourished.  HENT:  Head: Normocephalic and atraumatic.  Eyes: Conjunctivae and EOM are normal. Pupils are equal, round, and reactive to light.  Neck: Normal range of motion. Neck supple.  Cardiovascular: Normal rate, regular rhythm and normal heart sounds.   Respiratory: Effort normal and breath sounds normal.  GI: Soft. Bowel sounds are normal. She exhibits no distension. There is no tenderness. There is no rebound and no guarding.    Musculoskeletal: Normal range of motion.  Neurological: She is alert and oriented to person,  place, and time. She has normal reflexes.  Skin: Skin is warm and dry.     Assessment/Plan Symptomatic paraesophageal hernia with intermittent obstruction. CT demonstrates some obstruction of the stomach, but nothing that looks ischemic or strangulated.  Will treat symptomatically, sing out to surgeon of the week who will consider laparoscopic reduction and repair. IV hydration, pain control and anti-nausea medication  Jimmye NormanJAMES , MD 04/03/2016, 12:47 AM

## 2016-04-03 NOTE — Anesthesia Procedure Notes (Signed)
Procedure Name: Intubation Date/Time: 04/03/2016 10:27 AM Performed by: Samara DeistBECKNER,  B Pre-anesthesia Checklist: Patient identified, Emergency Drugs available, Suction available, Patient being monitored and Timeout performed Patient Re-evaluated:Patient Re-evaluated prior to inductionOxygen Delivery Method: Circle system utilized Preoxygenation: Pre-oxygenation with 100% oxygen Intubation Type: IV induction, Rapid sequence and Cricoid Pressure applied Laryngoscope Size: Glidescope and 4 Grade View: Grade I Tube type: Oral Tube size: 7.0 mm Number of attempts: 1 Airway Equipment and Method: Video-laryngoscopy and Stylet Placement Confirmation: ETT inserted through vocal cords under direct vision,  positive ETCO2 and breath sounds checked- equal and bilateral Secured at: 21 cm Tube secured with: Tape Dental Injury: Teeth and Oropharynx as per pre-operative assessment

## 2016-04-04 ENCOUNTER — Encounter (HOSPITAL_COMMUNITY): Payer: Self-pay | Admitting: General Surgery

## 2016-04-04 ENCOUNTER — Inpatient Hospital Stay (HOSPITAL_COMMUNITY): Payer: Medicare Other

## 2016-04-04 LAB — BASIC METABOLIC PANEL
ANION GAP: 7 (ref 5–15)
BUN: 6 mg/dL (ref 6–20)
CHLORIDE: 100 mmol/L — AB (ref 101–111)
CO2: 28 mmol/L (ref 22–32)
Calcium: 8.2 mg/dL — ABNORMAL LOW (ref 8.9–10.3)
Creatinine, Ser: 0.95 mg/dL (ref 0.44–1.00)
GFR, EST NON AFRICAN AMERICAN: 60 mL/min — AB (ref >60)
Glucose, Bld: 165 mg/dL — ABNORMAL HIGH (ref 65–99)
POTASSIUM: 4.6 mmol/L (ref 3.5–5.1)
SODIUM: 135 mmol/L (ref 135–145)

## 2016-04-04 MED ORDER — IOPAMIDOL (ISOVUE-300) INJECTION 61%
INTRAVENOUS | Status: AC
  Administered 2016-04-04: 100 mL via ORAL
  Filled 2016-04-04: qty 150

## 2016-04-04 MED ORDER — HYDROCODONE-ACETAMINOPHEN 7.5-325 MG/15ML PO SOLN
10.0000 mL | ORAL | Status: DC | PRN
  Administered 2016-04-04: 10 mL via ORAL
  Filled 2016-04-04: qty 15

## 2016-04-04 MED ORDER — IOPAMIDOL (ISOVUE-300) INJECTION 61%
150.0000 mL | Freq: Once | INTRAVENOUS | Status: AC | PRN
  Administered 2016-04-04: 100 mL via ORAL

## 2016-04-04 NOTE — Plan of Care (Signed)
Problem: Food- and Nutrition-Related Knowledge Deficit (NB-1.1) Goal: Nutrition education Formal process to instruct or train a patient/client in a skill or to impart knowledge to help patients/clients voluntarily manage or modify food choices and eating behavior to maintain or improve health. Nutrition Education Note  RD consulted for nutrition education regarding nissen fundoplication.   RD provided "Esophageal Surgery Nutrition Therapy" handout from the [employer]. Reviewed patient's dietary recall. Discussed properties and rationale for current diet order (clear liquid). Educated pt on mechanically altering favorite foods to assist with ease of intake. Discussed diet progression (full liquid to puree to mechanical soft). Encouraged pt to advance diet as tolerated and to add sauces or gravies and to chop, puree foods as needed. Encouraged consumption of soft, easy to digest textures.   RD discussed why it is important for patient to adhere to diet recommendations. Teach back method used.  Expect fair to good compliance.  Body mass index is 23.67 kg/m. Pt meets criteria for normal weight range based on current BMI.  Current diet order is clear liquid, patient is consuming approximately n/a% of meals at this time. Labs and medications reviewed. No further nutrition interventions warranted at this time. RD contact information provided. If additional nutrition issues arise, please re-consult RD.    A. Mayford KnifeWilliams, RD, LDN, CDE Pager: [phone] After hours Pager: [phone]

## 2016-04-04 NOTE — Progress Notes (Signed)
[facility] Progress Note  1 Day Post-Op  Subjective: Reports some abdominal soreness. Denies nausea or vomiting. Unable to report if she is having flatus. Reports using IS and ambulating.  Objective: Vital signs in last 24 hours: Temp:  [97.2 F (36.2 C)-98.7 F (37.1 C)] 97.2 F (36.2 C) (01/23 0411) Pulse Rate:  [76-89] 78 (01/23 0411) Resp:  [14-18] 17 (01/23 0411) BP: (133-195)/(51-93) 133/71 (01/23 0411) SpO2:  [92 %-100 %] 92 % (01/23 0411) Weight:  [72.7 kg (160 lb 4.4 oz)] 72.7 kg (160 lb 4.4 oz) (01/22 1643) Last BM Date: 04/01/16  Intake/Output from previous day: 01/22 0701 - 01/23 0700 In: 2400 [I.V.:2400] Out: 1600 [Urine:1600] Intake/Output this shift: No intake/output data recorded.  PE: Gen:  Alert, NAD, pleasant Card:  RRR Pulm:  CTAB, normal effort Abd: Soft, appropriately tender, ND, hypoactive BS, no HSM Ext:  No erythema, edema, or tenderness   Lab Results:   Recent Labs  04/03/16 0105  WBC 12.2*  HGB 13.2  HCT 40.6  PLT 240   BMET  Recent Labs  04/03/16 0105 04/04/16 0445  NA 139 135  K 4.4 4.6  CL 102 100*  CO2 29 28  GLUCOSE 223* 165*  BUN 9 6  CREATININE 1.13* 0.95  CALCIUM 9.0 8.2*   PT/INR No results for input(s): LABPROT, INR in the last 72 hours. CMP     Component Value Date/Time   NA 135 04/04/2016 0445   K 4.6 04/04/2016 0445   CL 100 (L) 04/04/2016 0445   CO2 28 04/04/2016 0445   GLUCOSE 165 (H) 04/04/2016 0445   BUN 6 04/04/2016 0445   CREATININE 0.95 04/04/2016 0445   CALCIUM 8.2 (L) 04/04/2016 0445   GFRNONAA 60 (L) 04/04/2016 0445   GFRAA >60 04/04/2016 0445   Lipase  No results found for: LIPASE     Studies/Results: Dg Esophagus W/water Sol Cm  Result Date: 04/04/2016 CLINICAL DATA:  Hiatal hernia status post reduction and fundoplication. EXAM: ESOPHOGRAM/BARIUM SWALLOW TECHNIQUE: Single contrast examination was performed using  water-soluble. FLUOROSCOPY TIME:  Fluoroscopy Time:  0.7  minutes Radiation Exposure Index (if provided by the fluoroscopic device): 6.3 mGy Number of Acquired Spot Images: 1 COMPARISON:  None. FINDINGS: The patient swallowed in the LPO position without incident. There was no delay in the contrast from reaching the stomach. Opacification of the stomach showed an unexpectedly large filling defect at the GE junction with broad shouldered appearance. Along the greater curvature of the stomach is an unexpected contour defect and luminal mass effect. No visible enhancing mass on preoperative CT. Noted reporte doperative indication of gastric mass. Lower esophageal narrowing causes mild esophageal stasis. No aspiration noted. No extraluminal noted. IMPRESSION: Unexpected filling defect along the gastric cardia and greater curvature is presumably postoperative hemorrhage/edema. Mild narrowing at the GE junction with esophageal stasis. Negative for leak. Electronically Signed   By: Marnee SpringJonathon  Watts M.D.   On: 04/04/2016 09:54    Anti-infectives: Anti-infectives    None     Assessment/Plan Hiatal hernia S/P laparoscopic repair of hiatal hernia with mesh, laparoscopic nissen fundoplication.  - esophogram negative for post-operative leak - clear liquid diet - RD consult for patient education - ambulate/IS  Plan: clear liquids, ambulate Anticipate discharge home tomorrow.   LOS: 1 day    Adam PhenixElizabeth S  , [facility] 04/04/2016, 10:44 AM Pager: [phone] Consults: [phone] Mon-Fri 7:00 am-4:30 pm Sat-Sun 7:00 am-11:30 am

## 2016-04-04 NOTE — Anesthesia Postprocedure Evaluation (Signed)
Anesthesia Post Note  Patient: Debra Newman  Procedure(s) Performed: Procedure(s) (LRB): LAPAROSCOPIC REPAIR OF HIATAL HERNIA WITH MESH (N/A) LAPAROSCOPIC NISSEN FUNDOPLICATION (N/A)  Patient location during evaluation: PACU Anesthesia Type: General Level of consciousness: awake and alert Pain management: pain level controlled Vital Signs Assessment: post-procedure vital signs reviewed and stable Respiratory status: spontaneous breathing, nonlabored ventilation, respiratory function stable and patient connected to nasal cannula oxygen Cardiovascular status: blood pressure returned to baseline and stable Postop Assessment: no signs of nausea or vomiting Anesthetic complications: no       Last Vitals:  Vitals:   04/04/16 0137 04/04/16 0411  BP: (!) 142/51 133/71  Pulse: 84 78  Resp: 18 17  Temp: 36.4 C 36.2 C    Last Pain:  Vitals:   04/04/16 0411  TempSrc: Oral  PainSc:                  Phillips Groutarignan, 

## 2016-04-05 MED ORDER — HYDROCODONE-ACETAMINOPHEN 7.5-325 MG/15ML PO SOLN: 10.0000 mL | ORAL | 0 refills | Status: DC | PRN

## 2016-04-05 NOTE — Progress Notes (Signed)
Discharge home. Home discharge instruction given, no questions verbalized. 

## 2016-04-05 NOTE — Progress Notes (Signed)
Patient ID: Debra Newman, female   DOB: February 22, 1946, 71 y.o.   MRN: [id number]  [facility]  2 Days Post-Op  Subjective: Feeling well today, ready to go home. Tolerating clear liquids. Passing flatus. Denies n/v.  Has ambulated with no issues.  Objective: Vital signs in last 24 hours: Temp:  [98.5 F (36.9 C)-99.5 F (37.5 C)] 99.1 F (37.3 C) (01/24 0500) Pulse Rate:  [89-96] 89 (01/24 0500) Resp:  [18-19] 19 (01/24 0500) BP: (137-163)/(54-68) 163/68 (01/24 0500) SpO2:  [94 %-95 %] 95 % (01/24 0500) Last BM Date: 04/01/16  Intake/Output from previous day: 01/23 0701 - 01/24 0700 In: 1540 [P.O.:240; I.V.:1300] Out: 900 [Urine:900] Intake/Output this shift: No intake/output data recorded.  PE: Gen:  Alert, NAD, pleasant Pulm:  normal effort Abd: Soft, appropriately tender, ND, +BS, no HSM, multiple lap incisions covered with clean/dry dressings. Ext:  No erythema, edema, or tenderness   Lab Results:   Recent Labs  04/03/16 0105  WBC 12.2*  HGB 13.2  HCT 40.6  PLT 240   BMET  Recent Labs  04/03/16 0105 04/04/16 0445  NA 139 135  K 4.4 4.6  CL 102 100*  CO2 29 28  GLUCOSE 223* 165*  BUN 9 6  CREATININE 1.13* 0.95  CALCIUM 9.0 8.2*   PT/INR No results for input(s): LABPROT, INR in the last 72 hours. CMP     Component Value Date/Time   NA 135 04/04/2016 0445   K 4.6 04/04/2016 0445   CL 100 (L) 04/04/2016 0445   CO2 28 04/04/2016 0445   GLUCOSE 165 (H) 04/04/2016 0445   BUN 6 04/04/2016 0445   CREATININE 0.95 04/04/2016 0445   CALCIUM 8.2 (L) 04/04/2016 0445   GFRNONAA 60 (L) 04/04/2016 0445   GFRAA >60 04/04/2016 0445   Lipase  No results found for: LIPASE     Studies/Results: Dg Esophagus W/water Sol Cm  Result Date: 04/04/2016 CLINICAL DATA:  Hiatal hernia status post reduction and fundoplication. EXAM: ESOPHOGRAM/BARIUM SWALLOW TECHNIQUE: Single contrast examination was performed using  water-soluble.  FLUOROSCOPY TIME:  Fluoroscopy Time:  0.7 minutes Radiation Exposure Index (if provided by the fluoroscopic device): 6.3 mGy Number of Acquired Spot Images: 1 COMPARISON:  None. FINDINGS: The patient swallowed in the LPO position without incident. There was no delay in the contrast from reaching the stomach. Opacification of the stomach showed an unexpectedly large filling defect at the GE junction with broad shouldered appearance. Along the greater curvature of the stomach is an unexpected contour defect and luminal mass effect. No visible enhancing mass on preoperative CT. Noted reporte doperative indication of gastric mass. Lower esophageal narrowing causes mild esophageal stasis. No aspiration noted. No extraluminal noted. IMPRESSION: Unexpected filling defect along the gastric cardia and greater curvature is presumably postoperative hemorrhage/edema. Mild narrowing at the GE junction with esophageal stasis. Negative for leak. Electronically Signed   By: Marnee SpringJonathon  Watts M.D.   On: 04/04/2016 09:54    Anti-infectives: Anti-infectives    None       Assessment/Plan Hiatal hernia S/P laparoscopic repair of hiatal hernia with mesh, laparoscopic nissen fundoplication.  1/22 Dr. Derrell Lollingamirez - POD 2 - esophogram negative for post-operative leak - tolerating clear liquid diet. Appreciate RD recommendations - ambulate/IS  Plan: patient ready for discharge. She will remain on a pureed diet until follow-up. [place]ett PRN pain. Follow-up with Dr. Derrell Lollingamirez in 2 weeks.   LOS: 2 days    Edson SnowballBROOKE A MILLER , [facility]  04/05/2016, 1:13 PM Pager: [phone] Consults: [phone] Mon-Fri 7:00 am-4:30 pm Sat-Sun 7:00 am-11:30 am

## 2016-04-06 NOTE — Discharge Summary (Signed)
[facility] Discharge Summary   Patient ID: Debra Newman MRN: [id number] DOB/AGE: 15-May-1945 71 y.o.  Admit date: 04/03/2016 Discharge date: 04/05/2016  Admitting Diagnosis: S/p Nissen fundoplication   Discharge Diagnosis Patient Active Problem List   Diagnosis Date Noted  . 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 Mild narrowing at the GE junction with esophageal stasis. Negative for leak.  Procedures Dr. Derrell Lollingamirez (04/03/16) - LAPAROSCOPIC REPAIR OF HIATAL HERNIA WITH MESH (N/A), LAPAROSCOPIC NISSEN FUNDOPLICATION (N/A)  Hospital Course:  Debra Signsatsy Pardo is a 71yo female [facility] with recurrent postprandial nausea, vomiting, and abdominal pain. She has a known hiatal hernia with similar attacks.  Workup included a CT which demonstrated paraesophageal hernia with possible volvulus, no ischemia or strangulation.  Patient was admitted for IV hydration, pain control, and antiemesis medication. The following day her symptoms had improved, but she decided to proceed with the procedure listed above to prevent future attacks. Tolerated procedure well and was transferred to the floor.  On POD1 esophogram was negative for post-operative leak therefore she was advanced to clear liquid diet. On POD2 the patient was voiding well, tolerating diet, ambulating well, pain well controlled, vital signs stable, incisions c/d/i and felt stable for discharge home.  She will remain on a pureed diet until follow-up. Patient will follow up with Dr. Derrell Lollingamirez in 2 weeks and knows to call with questions or concerns.    Physical Exam: Gen: Alert, NAD, pleasant Pulm: normal effort Abd: Soft, appropriately tender, ND, +BS, no HSM, multiple lap incisions covered with clean/dry dressings. Ext: No erythema,  edema, or tenderness   Allergies as of 04/05/2016   No Known Allergies     Medication List    STOP taking these medications   ibuprofen 200 MG tablet Commonly known as:  ADVIL,MOTRIN   metformin 1000 MG (OSM) 24 hr tablet Commonly known as:  FORTAMET   pravastatin 20 MG tablet Commonly known as:  PRAVACHOL     TAKE these medications   HYDROcodone-acetaminophen 7.5-325 mg/15 ml solution Commonly known as:  HYCET Take 10 mLs by mouth every 4 (four) hours as needed for moderate pain.        Follow-up Information    Lajean Saveramirez Jr., Armando, MD. Schedule an appointment as soon as possible for a visit in 2 week(s).   Specialty:  General Surgery Contact information: [address] [place] [address] [phone]           Signed: Edson SnowballBROOKE A MILLER, [facility] 04/06/2016, 3:06 PM Pager: [phone] Consults: [phone] Mon-Fri 7:00 am-4:30 pm Sat-Sun 7:00 am-11:30 am

## 2016-04-07 NOTE — Consult Note (Signed)
            [facility] Primary [facility]  04/07/2016  Debra Newman 27-Dec-1945 [id number]   Attempt to see patient at the bedside to identify possible discharge needsbut staff states she was already discharged to home.  Primary care provider's office called [name] to notify of patient's discharge and need for post hospital follow-up and transition of care.  Also made aware to refer patient to [facility] care management for care coordination needs if deemed appropriate for services.  For additional questions please contact:  Karin GoldenLorraine A. , BSN, RN-BC [facility] PRIMARY CARE Navigator Cell: [phone]

## 2016-04-10 DIAGNOSIS — Z6823 Body mass index (BMI) 23.0-23.9, adult: Secondary | ICD-10-CM | POA: Diagnosis not present

## 2016-04-10 DIAGNOSIS — K449 Diaphragmatic hernia without obstruction or gangrene: Secondary | ICD-10-CM | POA: Diagnosis not present

## 2016-06-05 DIAGNOSIS — E785 Hyperlipidemia, unspecified: Secondary | ICD-10-CM | POA: Diagnosis not present

## 2016-06-05 DIAGNOSIS — E1140 Type 2 diabetes mellitus with diabetic neuropathy, unspecified: Secondary | ICD-10-CM | POA: Diagnosis not present

## 2016-06-05 DIAGNOSIS — I10 Essential (primary) hypertension: Secondary | ICD-10-CM | POA: Diagnosis not present

## 2016-06-05 DIAGNOSIS — Z6823 Body mass index (BMI) 23.0-23.9, adult: Secondary | ICD-10-CM | POA: Diagnosis not present

## 2016-06-26 DIAGNOSIS — E119 Type 2 diabetes mellitus without complications: Secondary | ICD-10-CM | POA: Diagnosis not present

## 2016-06-26 DIAGNOSIS — H524 Presbyopia: Secondary | ICD-10-CM | POA: Diagnosis not present

## 2016-06-26 DIAGNOSIS — H2513 Age-related nuclear cataract, bilateral: Secondary | ICD-10-CM | POA: Diagnosis not present

## 2016-12-11 DIAGNOSIS — E785 Hyperlipidemia, unspecified: Secondary | ICD-10-CM | POA: Diagnosis not present

## 2016-12-11 DIAGNOSIS — E1140 Type 2 diabetes mellitus with diabetic neuropathy, unspecified: Secondary | ICD-10-CM | POA: Diagnosis not present

## 2016-12-11 DIAGNOSIS — Z6825 Body mass index (BMI) 25.0-25.9, adult: Secondary | ICD-10-CM | POA: Diagnosis not present

## 2016-12-11 DIAGNOSIS — I10 Essential (primary) hypertension: Secondary | ICD-10-CM | POA: Diagnosis not present

## 2016-12-11 DIAGNOSIS — Z1389 Encounter for screening for other disorder: Secondary | ICD-10-CM | POA: Diagnosis not present

## 2016-12-11 DIAGNOSIS — Z9181 History of falling: Secondary | ICD-10-CM | POA: Diagnosis not present

## 2017-06-25 DIAGNOSIS — E785 Hyperlipidemia, unspecified: Secondary | ICD-10-CM | POA: Diagnosis not present

## 2017-06-25 DIAGNOSIS — E1140 Type 2 diabetes mellitus with diabetic neuropathy, unspecified: Secondary | ICD-10-CM | POA: Diagnosis not present

## 2017-06-25 DIAGNOSIS — I10 Essential (primary) hypertension: Secondary | ICD-10-CM | POA: Diagnosis not present

## 2017-08-01 DIAGNOSIS — H524 Presbyopia: Secondary | ICD-10-CM | POA: Diagnosis not present

## 2017-08-01 DIAGNOSIS — E119 Type 2 diabetes mellitus without complications: Secondary | ICD-10-CM | POA: Diagnosis not present

## 2017-08-01 DIAGNOSIS — H2513 Age-related nuclear cataract, bilateral: Secondary | ICD-10-CM | POA: Diagnosis not present

## 2017-09-07 IMAGING — RF DG ESOPHAGUS
7 series · 13 of 13 positions shown · non-contrast
Comparison: None.

CLINICAL DATA: Hiatal hernia status post reduction and
fundoplication.

EXAM:
ESOPHOGRAM/BARIUM SWALLOW
TECHNIQUE: Single contrast examination was performed using  water-soluble.
FLUOROSCOPY TIME:  Fluoroscopy Time:  0.7 minutes
Radiation Exposure Index (if provided by the fluoroscopic device):
6.3 mGy
Number of Acquired Spot Images: 1

[Series 1: cp_standard · 0.51mm/px · 4 of 39 frames shown (1 of 6)]
[frame 6/39]
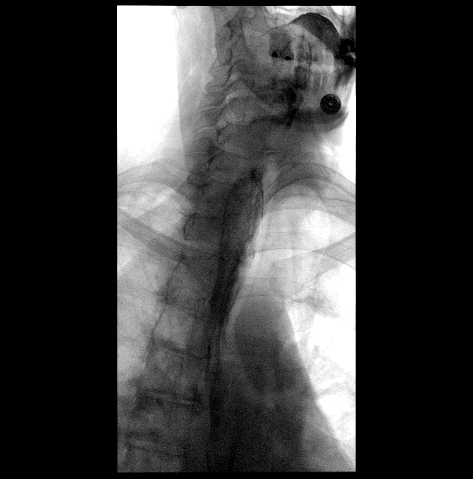
[frame 15/39]
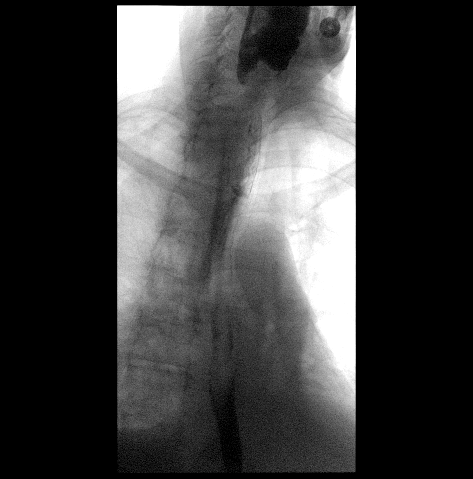
[frame 20/39]
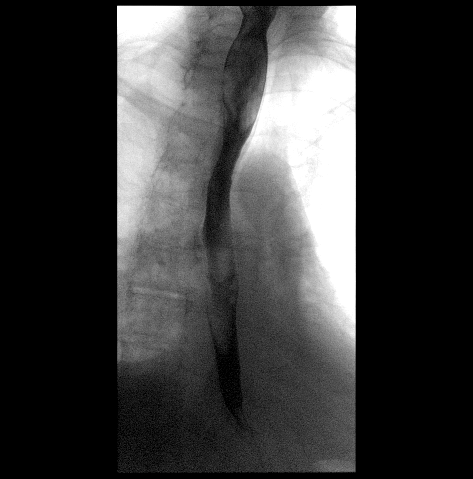
[frame 34/39]
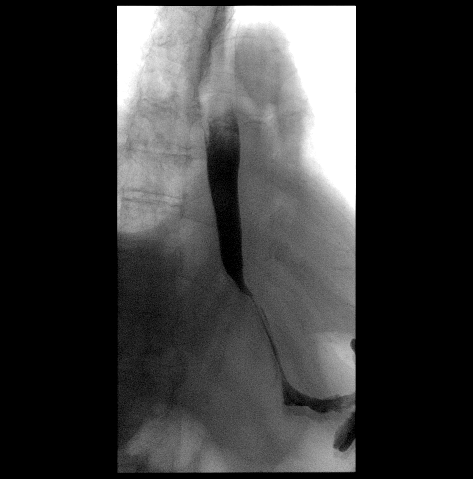

[Series 2: cp_standard · 0.51mm/px · 4 of 11 frames shown (2 of 6)]
[frame 2/11]
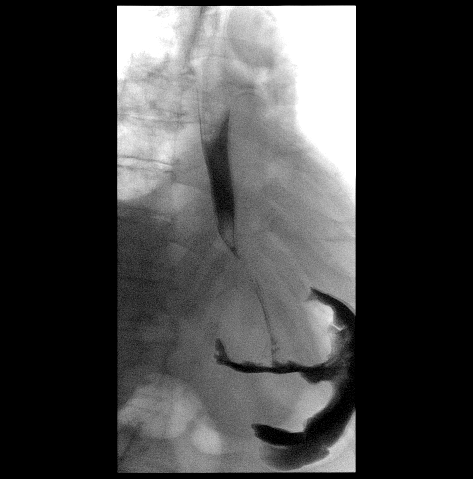
[frame 6/11]
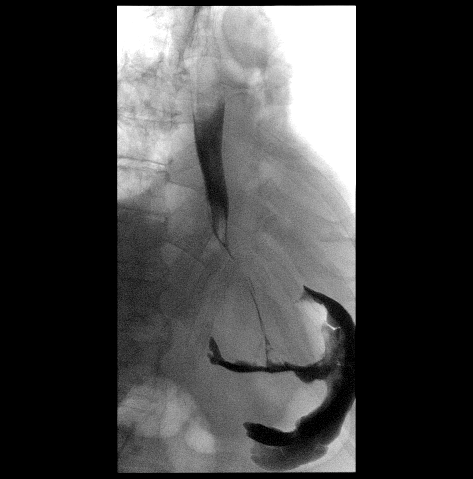
[frame 8/11]
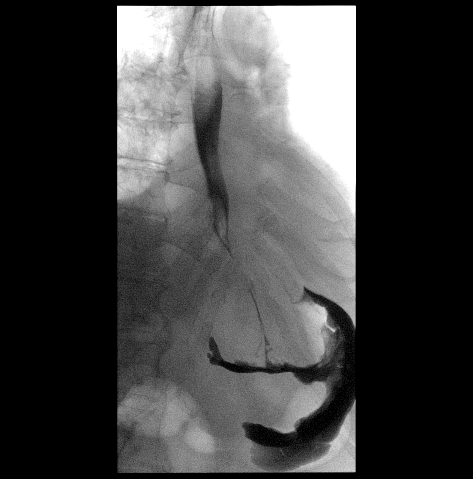
[frame 10/11]
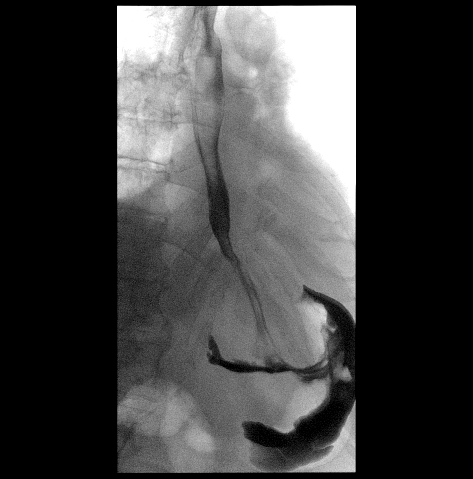

[Series 3: cp_standard · 0.26mm/px · 1 of 1 slices shown (3 of 6)]
[im 1/1]
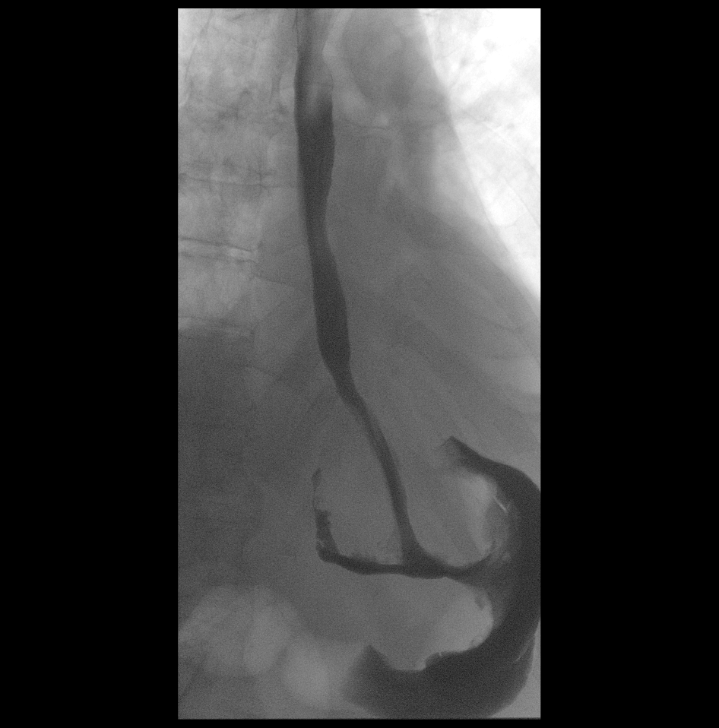

[Series 4: cp_standard · 0.25mm/px · 1 of 1 slices shown (4 of 6)]
[im 1/1]
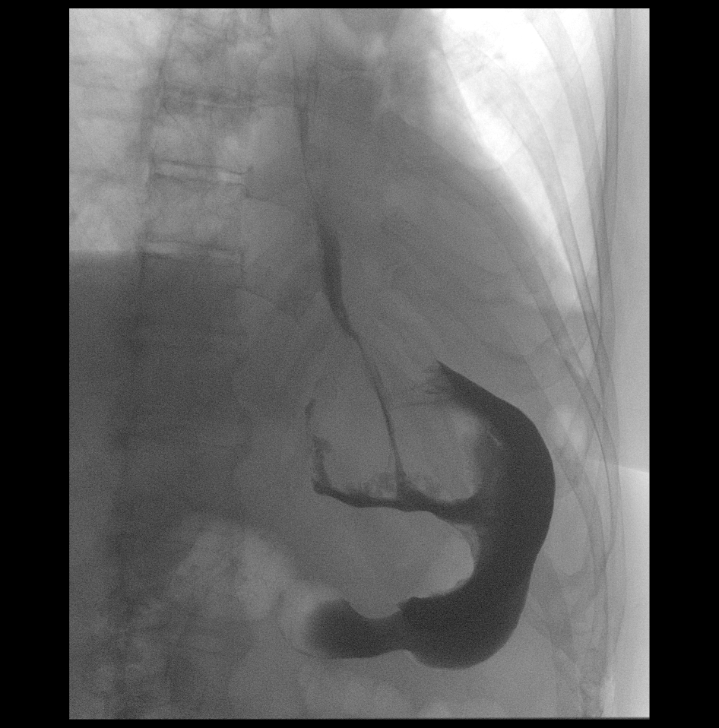

[Series 5: cp_standard · 0.25mm/px · 1 of 1 slices shown (5 of 6)]
[im 1/1]
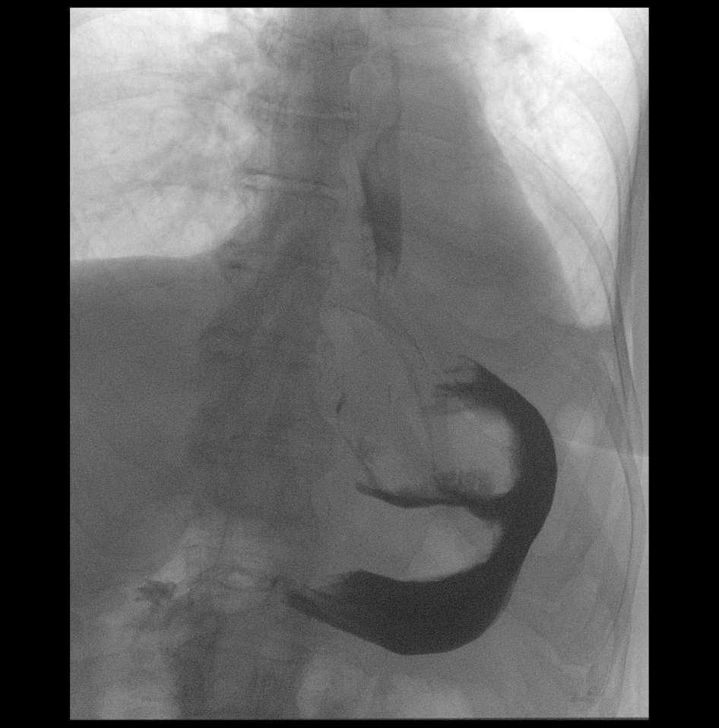

[Series 6: cp_standard · 0.25mm/px · 1 of 1 slices shown (6 of 6)]
[im 1/1]
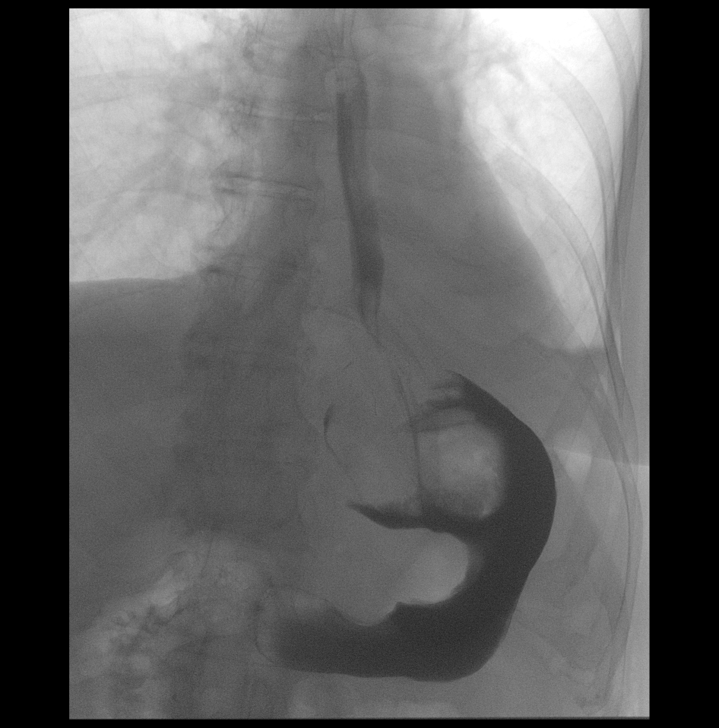

[Series 7: fluoro_barium 2fps_bw · 0.17mm/px · 1 of 1 slices shown]
[im 1/1]
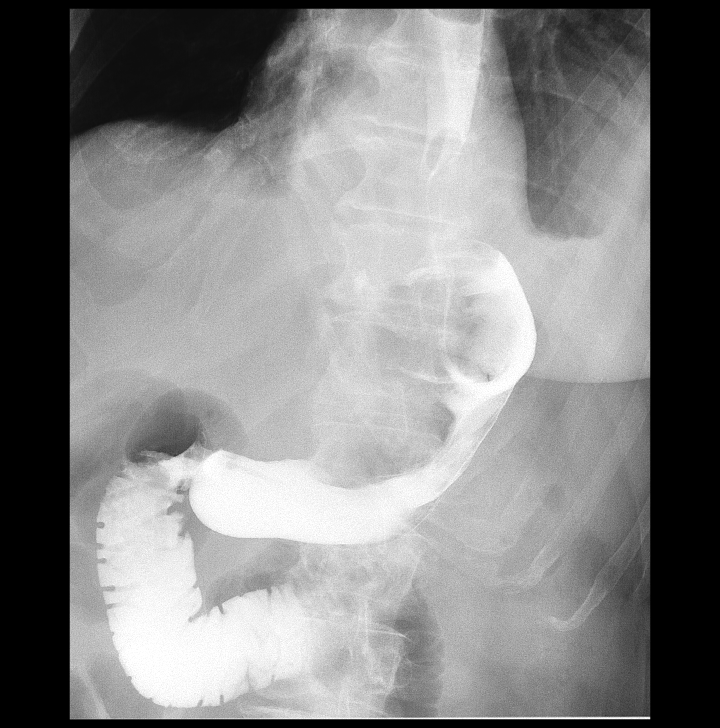

[13 of 13 positions shown; findings below may reference images not displayed]

FINDINGS: The patient swallowed in the LPO position without incident. There
was no delay in the contrast from reaching the stomach.
Opacification of the stomach showed an unexpectedly large filling
defect at the GE junction with broad shouldered appearance. Along
the greater curvature of the stomach is an unexpected contour defect
and luminal mass effect. No visible enhancing mass on preoperative
CT. Noted reporte doperative indication of gastric mass. Lower
esophageal narrowing causes mild esophageal stasis. No aspiration
noted. No extraluminal noted.
IMPRESSION: Unexpected filling defect along the gastric cardia and greater
curvature is presumably postoperative hemorrhage/edema.

Mild narrowing at the GE junction with esophageal stasis. Negative
for leak.

## 2017-12-24 DIAGNOSIS — Z6826 Body mass index (BMI) 26.0-26.9, adult: Secondary | ICD-10-CM | POA: Diagnosis not present

## 2017-12-24 DIAGNOSIS — I10 Essential (primary) hypertension: Secondary | ICD-10-CM | POA: Diagnosis not present

## 2017-12-24 DIAGNOSIS — E1140 Type 2 diabetes mellitus with diabetic neuropathy, unspecified: Secondary | ICD-10-CM | POA: Diagnosis not present

## 2017-12-24 DIAGNOSIS — E785 Hyperlipidemia, unspecified: Secondary | ICD-10-CM | POA: Diagnosis not present

## 2018-01-30 ENCOUNTER — Other Ambulatory Visit: Payer: Self-pay

## 2018-02-12 DIAGNOSIS — J208 Acute bronchitis due to other specified organisms: Secondary | ICD-10-CM | POA: Diagnosis not present

## 2018-03-04 DIAGNOSIS — R51 Headache: Secondary | ICD-10-CM | POA: Diagnosis not present

## 2018-03-04 DIAGNOSIS — S0990XA Unspecified injury of head, initial encounter: Secondary | ICD-10-CM | POA: Diagnosis not present

## 2018-03-04 DIAGNOSIS — I10 Essential (primary) hypertension: Secondary | ICD-10-CM | POA: Diagnosis not present

## 2018-03-04 DIAGNOSIS — R52 Pain, unspecified: Secondary | ICD-10-CM | POA: Diagnosis not present

## 2018-03-04 DIAGNOSIS — R220 Localized swelling, mass and lump, head: Secondary | ICD-10-CM | POA: Diagnosis not present

## 2018-03-20 DIAGNOSIS — J208 Acute bronchitis due to other specified organisms: Secondary | ICD-10-CM | POA: Diagnosis not present

## 2018-04-12 DIAGNOSIS — J208 Acute bronchitis due to other specified organisms: Secondary | ICD-10-CM | POA: Diagnosis not present

## 2018-07-01 DIAGNOSIS — Z9181 History of falling: Secondary | ICD-10-CM | POA: Diagnosis not present

## 2018-07-01 DIAGNOSIS — Z6825 Body mass index (BMI) 25.0-25.9, adult: Secondary | ICD-10-CM | POA: Diagnosis not present

## 2018-07-01 DIAGNOSIS — Z1331 Encounter for screening for depression: Secondary | ICD-10-CM | POA: Diagnosis not present

## 2018-07-01 DIAGNOSIS — E1140 Type 2 diabetes mellitus with diabetic neuropathy, unspecified: Secondary | ICD-10-CM | POA: Diagnosis not present

## 2018-07-01 DIAGNOSIS — E785 Hyperlipidemia, unspecified: Secondary | ICD-10-CM | POA: Diagnosis not present

## 2018-07-01 DIAGNOSIS — I10 Essential (primary) hypertension: Secondary | ICD-10-CM | POA: Diagnosis not present

## 2019-01-06 DIAGNOSIS — I10 Essential (primary) hypertension: Secondary | ICD-10-CM | POA: Diagnosis not present

## 2019-01-06 DIAGNOSIS — E1142 Type 2 diabetes mellitus with diabetic polyneuropathy: Secondary | ICD-10-CM | POA: Diagnosis not present

## 2019-01-06 DIAGNOSIS — Z139 Encounter for screening, unspecified: Secondary | ICD-10-CM | POA: Diagnosis not present

## 2019-01-06 DIAGNOSIS — E1165 Type 2 diabetes mellitus with hyperglycemia: Secondary | ICD-10-CM | POA: Diagnosis not present

## 2019-01-06 DIAGNOSIS — E785 Hyperlipidemia, unspecified: Secondary | ICD-10-CM | POA: Diagnosis not present

## 2019-01-30 DIAGNOSIS — Z9181 History of falling: Secondary | ICD-10-CM | POA: Diagnosis not present

## 2019-01-30 DIAGNOSIS — Z0000 Encounter for general adult medical examination without abnormal findings: Secondary | ICD-10-CM | POA: Diagnosis not present

## 2019-01-30 DIAGNOSIS — Z136 Encounter for screening for cardiovascular disorders: Secondary | ICD-10-CM | POA: Diagnosis not present

## 2019-01-30 DIAGNOSIS — N959 Unspecified menopausal and perimenopausal disorder: Secondary | ICD-10-CM | POA: Diagnosis not present

## 2019-01-30 DIAGNOSIS — E785 Hyperlipidemia, unspecified: Secondary | ICD-10-CM | POA: Diagnosis not present

## 2019-01-30 DIAGNOSIS — Z1331 Encounter for screening for depression: Secondary | ICD-10-CM | POA: Diagnosis not present

## 2019-01-30 DIAGNOSIS — Z1231 Encounter for screening mammogram for malignant neoplasm of breast: Secondary | ICD-10-CM | POA: Diagnosis not present

## 2019-01-30 DIAGNOSIS — Z1211 Encounter for screening for malignant neoplasm of colon: Secondary | ICD-10-CM | POA: Diagnosis not present

## 2019-05-09 DIAGNOSIS — B974 Respiratory syncytial virus as the cause of diseases classified elsewhere: Secondary | ICD-10-CM | POA: Diagnosis not present

## 2019-05-09 DIAGNOSIS — J101 Influenza due to other identified influenza virus with other respiratory manifestations: Secondary | ICD-10-CM | POA: Diagnosis not present

## 2019-05-09 DIAGNOSIS — U071 COVID-19: Secondary | ICD-10-CM | POA: Diagnosis not present

## 2019-05-09 DIAGNOSIS — Z20828 Contact with and (suspected) exposure to other viral communicable diseases: Secondary | ICD-10-CM | POA: Diagnosis not present

## 2019-07-07 DIAGNOSIS — E1165 Type 2 diabetes mellitus with hyperglycemia: Secondary | ICD-10-CM | POA: Diagnosis not present

## 2019-07-07 DIAGNOSIS — I10 Essential (primary) hypertension: Secondary | ICD-10-CM | POA: Diagnosis not present

## 2019-07-07 DIAGNOSIS — E1142 Type 2 diabetes mellitus with diabetic polyneuropathy: Secondary | ICD-10-CM | POA: Diagnosis not present

## 2019-07-07 DIAGNOSIS — E785 Hyperlipidemia, unspecified: Secondary | ICD-10-CM | POA: Diagnosis not present

## 2019-07-09 DIAGNOSIS — E785 Hyperlipidemia, unspecified: Secondary | ICD-10-CM | POA: Diagnosis not present

## 2020-01-06 DIAGNOSIS — E785 Hyperlipidemia, unspecified: Secondary | ICD-10-CM | POA: Diagnosis not present

## 2020-01-06 DIAGNOSIS — E1142 Type 2 diabetes mellitus with diabetic polyneuropathy: Secondary | ICD-10-CM | POA: Diagnosis not present

## 2020-01-06 DIAGNOSIS — Z6822 Body mass index (BMI) 22.0-22.9, adult: Secondary | ICD-10-CM | POA: Diagnosis not present

## 2020-01-06 DIAGNOSIS — E1165 Type 2 diabetes mellitus with hyperglycemia: Secondary | ICD-10-CM | POA: Diagnosis not present

## 2020-01-06 DIAGNOSIS — I10 Essential (primary) hypertension: Secondary | ICD-10-CM | POA: Diagnosis not present

## 2020-03-11 DIAGNOSIS — E119 Type 2 diabetes mellitus without complications: Secondary | ICD-10-CM | POA: Diagnosis not present

## 2020-03-11 DIAGNOSIS — H25813 Combined forms of age-related cataract, bilateral: Secondary | ICD-10-CM | POA: Diagnosis not present

## 2020-03-11 DIAGNOSIS — H527 Unspecified disorder of refraction: Secondary | ICD-10-CM | POA: Diagnosis not present

## 2020-04-27 DIAGNOSIS — Z9181 History of falling: Secondary | ICD-10-CM | POA: Diagnosis not present

## 2020-04-27 DIAGNOSIS — Z0000 Encounter for general adult medical examination without abnormal findings: Secondary | ICD-10-CM | POA: Diagnosis not present

## 2020-04-27 DIAGNOSIS — Z139 Encounter for screening, unspecified: Secondary | ICD-10-CM | POA: Diagnosis not present

## 2020-04-27 DIAGNOSIS — E785 Hyperlipidemia, unspecified: Secondary | ICD-10-CM | POA: Diagnosis not present

## 2020-04-27 DIAGNOSIS — Z1331 Encounter for screening for depression: Secondary | ICD-10-CM | POA: Diagnosis not present

## 2022-03-14 DIAGNOSIS — Z79631 Long term (current) use of antimetabolite agent: Secondary | ICD-10-CM | POA: Diagnosis not present

## 2022-03-14 DIAGNOSIS — Z79633 Long term (current) use of mitotic inhibitor: Secondary | ICD-10-CM | POA: Diagnosis not present

## 2022-03-14 DIAGNOSIS — C259 Malignant neoplasm of pancreas, unspecified: Secondary | ICD-10-CM | POA: Diagnosis not present

## 2022-03-14 DIAGNOSIS — E876 Hypokalemia: Secondary | ICD-10-CM | POA: Diagnosis not present

## 2022-03-14 DIAGNOSIS — R197 Diarrhea, unspecified: Secondary | ICD-10-CM | POA: Diagnosis not present

## 2022-03-14 DIAGNOSIS — C250 Malignant neoplasm of head of pancreas: Secondary | ICD-10-CM | POA: Diagnosis not present

## 2022-03-14 DIAGNOSIS — Z95828 Presence of other vascular implants and grafts: Secondary | ICD-10-CM | POA: Diagnosis not present

## 2022-03-29 DIAGNOSIS — Z006 Encounter for examination for normal comparison and control in clinical research program: Secondary | ICD-10-CM | POA: Diagnosis not present

## 2022-03-29 DIAGNOSIS — C259 Malignant neoplasm of pancreas, unspecified: Secondary | ICD-10-CM | POA: Diagnosis not present

## 2022-04-06 DIAGNOSIS — E119 Type 2 diabetes mellitus without complications: Secondary | ICD-10-CM | POA: Diagnosis not present

## 2022-04-06 DIAGNOSIS — Z9689 Presence of other specified functional implants: Secondary | ICD-10-CM | POA: Diagnosis not present

## 2022-04-06 DIAGNOSIS — Z4659 Encounter for fitting and adjustment of other gastrointestinal appliance and device: Secondary | ICD-10-CM | POA: Diagnosis not present

## 2022-04-06 DIAGNOSIS — E785 Hyperlipidemia, unspecified: Secondary | ICD-10-CM | POA: Diagnosis not present

## 2022-04-06 DIAGNOSIS — K831 Obstruction of bile duct: Secondary | ICD-10-CM | POA: Diagnosis not present

## 2022-04-06 DIAGNOSIS — Z9221 Personal history of antineoplastic chemotherapy: Secondary | ICD-10-CM | POA: Diagnosis not present

## 2022-04-06 DIAGNOSIS — K838 Other specified diseases of biliary tract: Secondary | ICD-10-CM | POA: Diagnosis not present

## 2022-04-06 DIAGNOSIS — C259 Malignant neoplasm of pancreas, unspecified: Secondary | ICD-10-CM | POA: Diagnosis not present

## 2022-04-06 DIAGNOSIS — Z4589 Encounter for adjustment and management of other implanted devices: Secondary | ICD-10-CM | POA: Diagnosis not present

## 2022-04-11 DIAGNOSIS — Z5111 Encounter for antineoplastic chemotherapy: Secondary | ICD-10-CM | POA: Diagnosis not present

## 2022-04-11 DIAGNOSIS — C259 Malignant neoplasm of pancreas, unspecified: Secondary | ICD-10-CM | POA: Diagnosis not present

## 2022-04-11 DIAGNOSIS — Z006 Encounter for examination for normal comparison and control in clinical research program: Secondary | ICD-10-CM | POA: Diagnosis not present

## 2022-04-14 DIAGNOSIS — Z7969 Long term (current) use of other immunomodulators and immunosuppressants: Secondary | ICD-10-CM | POA: Diagnosis not present

## 2022-04-14 DIAGNOSIS — Z7689 Persons encountering health services in other specified circumstances: Secondary | ICD-10-CM | POA: Diagnosis not present

## 2022-04-14 DIAGNOSIS — C250 Malignant neoplasm of head of pancreas: Secondary | ICD-10-CM | POA: Diagnosis not present

## 2022-04-25 DIAGNOSIS — Z79899 Other long term (current) drug therapy: Secondary | ICD-10-CM | POA: Diagnosis not present

## 2022-04-25 DIAGNOSIS — E1140 Type 2 diabetes mellitus with diabetic neuropathy, unspecified: Secondary | ICD-10-CM | POA: Diagnosis not present

## 2022-04-25 DIAGNOSIS — R197 Diarrhea, unspecified: Secondary | ICD-10-CM | POA: Diagnosis not present

## 2022-04-25 DIAGNOSIS — C259 Malignant neoplasm of pancreas, unspecified: Secondary | ICD-10-CM | POA: Diagnosis not present

## 2022-04-25 DIAGNOSIS — Z801 Family history of malignant neoplasm of trachea, bronchus and lung: Secondary | ICD-10-CM | POA: Diagnosis not present

## 2022-04-25 DIAGNOSIS — C250 Malignant neoplasm of head of pancreas: Secondary | ICD-10-CM | POA: Diagnosis not present

## 2022-04-28 DIAGNOSIS — C250 Malignant neoplasm of head of pancreas: Secondary | ICD-10-CM | POA: Diagnosis not present

## 2022-04-28 DIAGNOSIS — Z7689 Persons encountering health services in other specified circumstances: Secondary | ICD-10-CM | POA: Diagnosis not present

## 2022-04-28 DIAGNOSIS — Z7969 Long term (current) use of other immunomodulators and immunosuppressants: Secondary | ICD-10-CM | POA: Diagnosis not present

## 2022-05-05 DIAGNOSIS — Z006 Encounter for examination for normal comparison and control in clinical research program: Secondary | ICD-10-CM | POA: Diagnosis not present

## 2022-05-05 DIAGNOSIS — C259 Malignant neoplasm of pancreas, unspecified: Secondary | ICD-10-CM | POA: Diagnosis not present

## 2022-05-05 DIAGNOSIS — R601 Generalized edema: Secondary | ICD-10-CM | POA: Diagnosis not present

## 2022-05-05 DIAGNOSIS — R918 Other nonspecific abnormal finding of lung field: Secondary | ICD-10-CM | POA: Diagnosis not present

## 2022-05-05 DIAGNOSIS — K8689 Other specified diseases of pancreas: Secondary | ICD-10-CM | POA: Diagnosis not present

## 2022-05-05 DIAGNOSIS — J90 Pleural effusion, not elsewhere classified: Secondary | ICD-10-CM | POA: Diagnosis not present

## 2022-05-08 DIAGNOSIS — C250 Malignant neoplasm of head of pancreas: Secondary | ICD-10-CM | POA: Diagnosis not present

## 2022-05-11 DIAGNOSIS — Z79899 Other long term (current) drug therapy: Secondary | ICD-10-CM | POA: Diagnosis not present

## 2022-05-11 DIAGNOSIS — Z801 Family history of malignant neoplasm of trachea, bronchus and lung: Secondary | ICD-10-CM | POA: Diagnosis not present

## 2022-05-11 DIAGNOSIS — E876 Hypokalemia: Secondary | ICD-10-CM | POA: Diagnosis not present

## 2022-05-11 DIAGNOSIS — R197 Diarrhea, unspecified: Secondary | ICD-10-CM | POA: Diagnosis not present

## 2022-05-11 DIAGNOSIS — T451X5D Adverse effect of antineoplastic and immunosuppressive drugs, subsequent encounter: Secondary | ICD-10-CM | POA: Diagnosis not present

## 2022-05-11 DIAGNOSIS — R531 Weakness: Secondary | ICD-10-CM | POA: Diagnosis not present

## 2022-05-11 DIAGNOSIS — C250 Malignant neoplasm of head of pancreas: Secondary | ICD-10-CM | POA: Diagnosis not present

## 2022-05-11 DIAGNOSIS — L659 Nonscarring hair loss, unspecified: Secondary | ICD-10-CM | POA: Diagnosis not present

## 2022-05-11 DIAGNOSIS — C259 Malignant neoplasm of pancreas, unspecified: Secondary | ICD-10-CM | POA: Diagnosis not present

## 2022-05-12 DIAGNOSIS — C250 Malignant neoplasm of head of pancreas: Secondary | ICD-10-CM | POA: Diagnosis not present

## 2022-05-12 DIAGNOSIS — R7989 Other specified abnormal findings of blood chemistry: Secondary | ICD-10-CM | POA: Diagnosis not present

## 2022-05-15 DIAGNOSIS — C259 Malignant neoplasm of pancreas, unspecified: Secondary | ICD-10-CM | POA: Diagnosis not present

## 2022-05-15 DIAGNOSIS — Z5111 Encounter for antineoplastic chemotherapy: Secondary | ICD-10-CM | POA: Diagnosis not present

## 2022-05-15 DIAGNOSIS — R197 Diarrhea, unspecified: Secondary | ICD-10-CM | POA: Diagnosis not present

## 2022-05-18 DIAGNOSIS — M6281 Muscle weakness (generalized): Secondary | ICD-10-CM | POA: Diagnosis not present

## 2022-05-18 DIAGNOSIS — K625 Hemorrhage of anus and rectum: Secondary | ICD-10-CM | POA: Diagnosis not present

## 2022-05-18 DIAGNOSIS — E440 Moderate protein-calorie malnutrition: Secondary | ICD-10-CM | POA: Diagnosis not present

## 2022-05-18 DIAGNOSIS — R2681 Unsteadiness on feet: Secondary | ICD-10-CM | POA: Diagnosis not present

## 2022-05-18 DIAGNOSIS — Z743 Need for continuous supervision: Secondary | ICD-10-CM | POA: Diagnosis not present

## 2022-05-18 DIAGNOSIS — B81 Other intestinal helminthiases, not elsewhere classified: Secondary | ICD-10-CM | POA: Diagnosis not present

## 2022-05-18 DIAGNOSIS — C251 Malignant neoplasm of body of pancreas: Secondary | ICD-10-CM | POA: Diagnosis not present

## 2022-05-18 DIAGNOSIS — C259 Malignant neoplasm of pancreas, unspecified: Secondary | ICD-10-CM | POA: Diagnosis not present

## 2022-05-18 DIAGNOSIS — R627 Adult failure to thrive: Secondary | ICD-10-CM | POA: Diagnosis not present

## 2022-05-18 DIAGNOSIS — K521 Toxic gastroenteritis and colitis: Secondary | ICD-10-CM | POA: Diagnosis not present

## 2022-05-18 DIAGNOSIS — L89151 Pressure ulcer of sacral region, stage 1: Secondary | ICD-10-CM | POA: Diagnosis not present

## 2022-05-18 DIAGNOSIS — D696 Thrombocytopenia, unspecified: Secondary | ICD-10-CM | POA: Diagnosis not present

## 2022-05-18 DIAGNOSIS — R531 Weakness: Secondary | ICD-10-CM | POA: Diagnosis not present

## 2022-05-18 DIAGNOSIS — M810 Age-related osteoporosis without current pathological fracture: Secondary | ICD-10-CM | POA: Diagnosis not present

## 2022-05-18 DIAGNOSIS — D638 Anemia in other chronic diseases classified elsewhere: Secondary | ICD-10-CM | POA: Diagnosis not present

## 2022-05-18 DIAGNOSIS — E7800 Pure hypercholesterolemia, unspecified: Secondary | ICD-10-CM | POA: Diagnosis not present

## 2022-05-18 DIAGNOSIS — R197 Diarrhea, unspecified: Secondary | ICD-10-CM | POA: Diagnosis not present

## 2022-05-18 DIAGNOSIS — E46 Unspecified protein-calorie malnutrition: Secondary | ICD-10-CM | POA: Diagnosis not present

## 2022-05-18 DIAGNOSIS — D6481 Anemia due to antineoplastic chemotherapy: Secondary | ICD-10-CM | POA: Diagnosis not present

## 2022-05-18 DIAGNOSIS — D61810 Antineoplastic chemotherapy induced pancytopenia: Secondary | ICD-10-CM | POA: Diagnosis not present

## 2022-05-18 DIAGNOSIS — E860 Dehydration: Secondary | ICD-10-CM | POA: Diagnosis not present

## 2022-05-18 DIAGNOSIS — R000 Tachycardia, unspecified: Secondary | ICD-10-CM | POA: Diagnosis not present

## 2022-05-18 DIAGNOSIS — R739 Hyperglycemia, unspecified: Secondary | ICD-10-CM | POA: Diagnosis not present

## 2022-05-18 DIAGNOSIS — D72829 Elevated white blood cell count, unspecified: Secondary | ICD-10-CM | POA: Diagnosis not present

## 2022-05-18 DIAGNOSIS — E8809 Other disorders of plasma-protein metabolism, not elsewhere classified: Secondary | ICD-10-CM | POA: Diagnosis not present

## 2022-05-18 DIAGNOSIS — R2689 Other abnormalities of gait and mobility: Secondary | ICD-10-CM | POA: Diagnosis not present

## 2022-05-18 DIAGNOSIS — Z681 Body mass index (BMI) 19 or less, adult: Secondary | ICD-10-CM | POA: Diagnosis not present

## 2022-05-18 DIAGNOSIS — Z79899 Other long term (current) drug therapy: Secondary | ICD-10-CM | POA: Diagnosis not present

## 2022-05-18 DIAGNOSIS — I4891 Unspecified atrial fibrillation: Secondary | ICD-10-CM | POA: Diagnosis not present

## 2022-05-18 DIAGNOSIS — D72819 Decreased white blood cell count, unspecified: Secondary | ICD-10-CM | POA: Diagnosis not present

## 2022-05-18 DIAGNOSIS — R630 Anorexia: Secondary | ICD-10-CM | POA: Diagnosis not present

## 2022-05-18 DIAGNOSIS — D630 Anemia in neoplastic disease: Secondary | ICD-10-CM | POA: Diagnosis not present

## 2022-05-18 DIAGNOSIS — E119 Type 2 diabetes mellitus without complications: Secondary | ICD-10-CM | POA: Diagnosis not present

## 2022-05-18 DIAGNOSIS — E876 Hypokalemia: Secondary | ICD-10-CM | POA: Diagnosis not present

## 2022-05-18 DIAGNOSIS — K859 Acute pancreatitis without necrosis or infection, unspecified: Secondary | ICD-10-CM | POA: Diagnosis not present

## 2022-05-18 DIAGNOSIS — T451X5A Adverse effect of antineoplastic and immunosuppressive drugs, initial encounter: Secondary | ICD-10-CM | POA: Diagnosis not present

## 2022-05-18 DIAGNOSIS — K644 Residual hemorrhoidal skin tags: Secondary | ICD-10-CM | POA: Diagnosis not present

## 2022-06-02 DIAGNOSIS — D696 Thrombocytopenia, unspecified: Secondary | ICD-10-CM | POA: Diagnosis not present

## 2022-06-02 DIAGNOSIS — R262 Difficulty in walking, not elsewhere classified: Secondary | ICD-10-CM | POA: Diagnosis not present

## 2022-06-02 DIAGNOSIS — D61810 Antineoplastic chemotherapy induced pancytopenia: Secondary | ICD-10-CM | POA: Diagnosis not present

## 2022-06-02 DIAGNOSIS — Z743 Need for continuous supervision: Secondary | ICD-10-CM | POA: Diagnosis not present

## 2022-06-02 DIAGNOSIS — E876 Hypokalemia: Secondary | ICD-10-CM | POA: Diagnosis not present

## 2022-06-02 DIAGNOSIS — K859 Acute pancreatitis without necrosis or infection, unspecified: Secondary | ICD-10-CM | POA: Diagnosis not present

## 2022-06-02 DIAGNOSIS — R1030 Lower abdominal pain, unspecified: Secondary | ICD-10-CM | POA: Diagnosis not present

## 2022-06-02 DIAGNOSIS — K838 Other specified diseases of biliary tract: Secondary | ICD-10-CM | POA: Diagnosis not present

## 2022-06-02 DIAGNOSIS — E441 Mild protein-calorie malnutrition: Secondary | ICD-10-CM | POA: Diagnosis not present

## 2022-06-02 DIAGNOSIS — C251 Malignant neoplasm of body of pancreas: Secondary | ICD-10-CM | POA: Diagnosis not present

## 2022-06-02 DIAGNOSIS — Z4659 Encounter for fitting and adjustment of other gastrointestinal appliance and device: Secondary | ICD-10-CM | POA: Diagnosis not present

## 2022-06-02 DIAGNOSIS — C259 Malignant neoplasm of pancreas, unspecified: Secondary | ICD-10-CM | POA: Diagnosis not present

## 2022-06-02 DIAGNOSIS — R627 Adult failure to thrive: Secondary | ICD-10-CM | POA: Diagnosis not present

## 2022-06-02 DIAGNOSIS — D72819 Decreased white blood cell count, unspecified: Secondary | ICD-10-CM | POA: Diagnosis not present

## 2022-06-02 DIAGNOSIS — C250 Malignant neoplasm of head of pancreas: Secondary | ICD-10-CM | POA: Diagnosis not present

## 2022-06-02 DIAGNOSIS — Z79899 Other long term (current) drug therapy: Secondary | ICD-10-CM | POA: Diagnosis not present

## 2022-06-02 DIAGNOSIS — R2681 Unsteadiness on feet: Secondary | ICD-10-CM | POA: Diagnosis not present

## 2022-06-02 DIAGNOSIS — Z5111 Encounter for antineoplastic chemotherapy: Secondary | ICD-10-CM | POA: Diagnosis not present

## 2022-06-02 DIAGNOSIS — D638 Anemia in other chronic diseases classified elsewhere: Secondary | ICD-10-CM | POA: Diagnosis not present

## 2022-06-02 DIAGNOSIS — E46 Unspecified protein-calorie malnutrition: Secondary | ICD-10-CM | POA: Diagnosis not present

## 2022-06-02 DIAGNOSIS — R197 Diarrhea, unspecified: Secondary | ICD-10-CM | POA: Diagnosis not present

## 2022-06-02 DIAGNOSIS — M6281 Muscle weakness (generalized): Secondary | ICD-10-CM | POA: Diagnosis not present

## 2022-06-02 DIAGNOSIS — B81 Other intestinal helminthiases, not elsewhere classified: Secondary | ICD-10-CM | POA: Diagnosis not present

## 2022-06-02 DIAGNOSIS — R2689 Other abnormalities of gait and mobility: Secondary | ICD-10-CM | POA: Diagnosis not present

## 2022-06-02 DIAGNOSIS — D649 Anemia, unspecified: Secondary | ICD-10-CM | POA: Diagnosis not present

## 2022-06-02 DIAGNOSIS — K831 Obstruction of bile duct: Secondary | ICD-10-CM | POA: Diagnosis not present

## 2022-06-02 DIAGNOSIS — I10 Essential (primary) hypertension: Secondary | ICD-10-CM | POA: Diagnosis not present

## 2022-06-02 DIAGNOSIS — M810 Age-related osteoporosis without current pathological fracture: Secondary | ICD-10-CM | POA: Diagnosis not present

## 2022-06-02 DIAGNOSIS — D6481 Anemia due to antineoplastic chemotherapy: Secondary | ICD-10-CM | POA: Diagnosis not present

## 2022-06-05 DIAGNOSIS — D649 Anemia, unspecified: Secondary | ICD-10-CM | POA: Diagnosis not present

## 2022-06-05 DIAGNOSIS — R262 Difficulty in walking, not elsewhere classified: Secondary | ICD-10-CM | POA: Diagnosis not present

## 2022-06-05 DIAGNOSIS — R197 Diarrhea, unspecified: Secondary | ICD-10-CM | POA: Diagnosis not present

## 2022-06-05 DIAGNOSIS — E441 Mild protein-calorie malnutrition: Secondary | ICD-10-CM | POA: Diagnosis not present

## 2022-06-08 DIAGNOSIS — C259 Malignant neoplasm of pancreas, unspecified: Secondary | ICD-10-CM | POA: Diagnosis not present

## 2022-06-08 DIAGNOSIS — R197 Diarrhea, unspecified: Secondary | ICD-10-CM | POA: Diagnosis not present

## 2022-06-08 DIAGNOSIS — Z5111 Encounter for antineoplastic chemotherapy: Secondary | ICD-10-CM | POA: Diagnosis not present

## 2022-06-14 DIAGNOSIS — C250 Malignant neoplasm of head of pancreas: Secondary | ICD-10-CM | POA: Diagnosis not present

## 2022-06-22 DIAGNOSIS — C259 Malignant neoplasm of pancreas, unspecified: Secondary | ICD-10-CM | POA: Diagnosis not present

## 2022-06-22 DIAGNOSIS — K831 Obstruction of bile duct: Secondary | ICD-10-CM | POA: Diagnosis not present

## 2022-06-22 DIAGNOSIS — C250 Malignant neoplasm of head of pancreas: Secondary | ICD-10-CM | POA: Diagnosis not present

## 2022-06-22 DIAGNOSIS — Z4659 Encounter for fitting and adjustment of other gastrointestinal appliance and device: Secondary | ICD-10-CM | POA: Diagnosis not present

## 2022-06-22 DIAGNOSIS — K838 Other specified diseases of biliary tract: Secondary | ICD-10-CM | POA: Diagnosis not present

## 2022-06-26 DIAGNOSIS — E119 Type 2 diabetes mellitus without complications: Secondary | ICD-10-CM | POA: Diagnosis not present

## 2022-06-26 DIAGNOSIS — R935 Abnormal findings on diagnostic imaging of other abdominal regions, including retroperitoneum: Secondary | ICD-10-CM | POA: Diagnosis not present

## 2022-06-26 DIAGNOSIS — Z743 Need for continuous supervision: Secondary | ICD-10-CM | POA: Diagnosis not present

## 2022-06-26 DIAGNOSIS — K6389 Other specified diseases of intestine: Secondary | ICD-10-CM | POA: Diagnosis not present

## 2022-06-26 DIAGNOSIS — E875 Hyperkalemia: Secondary | ICD-10-CM | POA: Diagnosis not present

## 2022-06-26 DIAGNOSIS — R1031 Right lower quadrant pain: Secondary | ICD-10-CM | POA: Diagnosis not present

## 2022-06-26 DIAGNOSIS — M810 Age-related osteoporosis without current pathological fracture: Secondary | ICD-10-CM | POA: Diagnosis not present

## 2022-06-26 DIAGNOSIS — N179 Acute kidney failure, unspecified: Secondary | ICD-10-CM | POA: Diagnosis not present

## 2022-06-26 DIAGNOSIS — J811 Chronic pulmonary edema: Secondary | ICD-10-CM | POA: Diagnosis not present

## 2022-06-26 DIAGNOSIS — K567 Ileus, unspecified: Secondary | ICD-10-CM | POA: Diagnosis not present

## 2022-06-26 DIAGNOSIS — R1030 Lower abdominal pain, unspecified: Secondary | ICD-10-CM | POA: Diagnosis not present

## 2022-06-26 DIAGNOSIS — E876 Hypokalemia: Secondary | ICD-10-CM | POA: Diagnosis not present

## 2022-06-26 DIAGNOSIS — R188 Other ascites: Secondary | ICD-10-CM | POA: Diagnosis not present

## 2022-06-26 DIAGNOSIS — J90 Pleural effusion, not elsewhere classified: Secondary | ICD-10-CM | POA: Diagnosis not present

## 2022-06-26 DIAGNOSIS — L89301 Pressure ulcer of unspecified buttock, stage 1: Secondary | ICD-10-CM | POA: Diagnosis not present

## 2022-06-26 DIAGNOSIS — R2681 Unsteadiness on feet: Secondary | ICD-10-CM | POA: Diagnosis not present

## 2022-06-26 DIAGNOSIS — I480 Paroxysmal atrial fibrillation: Secondary | ICD-10-CM | POA: Diagnosis not present

## 2022-06-26 DIAGNOSIS — E7800 Pure hypercholesterolemia, unspecified: Secondary | ICD-10-CM | POA: Diagnosis not present

## 2022-06-26 DIAGNOSIS — K529 Noninfective gastroenteritis and colitis, unspecified: Secondary | ICD-10-CM | POA: Diagnosis not present

## 2022-06-26 DIAGNOSIS — K831 Obstruction of bile duct: Secondary | ICD-10-CM | POA: Diagnosis not present

## 2022-06-26 DIAGNOSIS — Z9689 Presence of other specified functional implants: Secondary | ICD-10-CM | POA: Diagnosis not present

## 2022-06-26 DIAGNOSIS — R2689 Other abnormalities of gait and mobility: Secondary | ICD-10-CM | POA: Diagnosis not present

## 2022-06-26 DIAGNOSIS — I4891 Unspecified atrial fibrillation: Secondary | ICD-10-CM | POA: Diagnosis not present

## 2022-06-26 DIAGNOSIS — M6281 Muscle weakness (generalized): Secondary | ICD-10-CM | POA: Diagnosis not present

## 2022-06-26 DIAGNOSIS — C259 Malignant neoplasm of pancreas, unspecified: Secondary | ICD-10-CM | POA: Diagnosis not present

## 2022-06-26 DIAGNOSIS — I959 Hypotension, unspecified: Secondary | ICD-10-CM | POA: Diagnosis not present

## 2022-06-26 DIAGNOSIS — I10 Essential (primary) hypertension: Secondary | ICD-10-CM | POA: Diagnosis not present

## 2022-06-26 DIAGNOSIS — E860 Dehydration: Secondary | ICD-10-CM | POA: Diagnosis not present

## 2022-06-26 DIAGNOSIS — D61810 Antineoplastic chemotherapy induced pancytopenia: Secondary | ICD-10-CM | POA: Diagnosis not present

## 2022-06-26 DIAGNOSIS — C251 Malignant neoplasm of body of pancreas: Secondary | ICD-10-CM | POA: Diagnosis not present

## 2022-06-26 DIAGNOSIS — A09 Infectious gastroenteritis and colitis, unspecified: Secondary | ICD-10-CM | POA: Diagnosis not present

## 2022-06-26 DIAGNOSIS — D638 Anemia in other chronic diseases classified elsewhere: Secondary | ICD-10-CM | POA: Diagnosis not present

## 2022-06-26 DIAGNOSIS — Z681 Body mass index (BMI) 19 or less, adult: Secondary | ICD-10-CM | POA: Diagnosis not present

## 2022-06-26 DIAGNOSIS — E43 Unspecified severe protein-calorie malnutrition: Secondary | ICD-10-CM | POA: Diagnosis not present

## 2022-06-26 DIAGNOSIS — D630 Anemia in neoplastic disease: Secondary | ICD-10-CM | POA: Diagnosis not present

## 2022-06-26 DIAGNOSIS — R197 Diarrhea, unspecified: Secondary | ICD-10-CM | POA: Diagnosis not present

## 2022-06-26 DIAGNOSIS — K219 Gastro-esophageal reflux disease without esophagitis: Secondary | ICD-10-CM | POA: Diagnosis not present

## 2022-06-26 DIAGNOSIS — E46 Unspecified protein-calorie malnutrition: Secondary | ICD-10-CM | POA: Diagnosis not present

## 2022-06-30 DIAGNOSIS — K219 Gastro-esophageal reflux disease without esophagitis: Secondary | ICD-10-CM | POA: Diagnosis not present

## 2022-06-30 DIAGNOSIS — Z743 Need for continuous supervision: Secondary | ICD-10-CM | POA: Diagnosis not present

## 2022-06-30 DIAGNOSIS — R188 Other ascites: Secondary | ICD-10-CM | POA: Diagnosis not present

## 2022-06-30 DIAGNOSIS — I480 Paroxysmal atrial fibrillation: Secondary | ICD-10-CM | POA: Diagnosis not present

## 2022-06-30 DIAGNOSIS — D61810 Antineoplastic chemotherapy induced pancytopenia: Secondary | ICD-10-CM | POA: Diagnosis not present

## 2022-06-30 DIAGNOSIS — I4891 Unspecified atrial fibrillation: Secondary | ICD-10-CM | POA: Diagnosis not present

## 2022-06-30 DIAGNOSIS — J90 Pleural effusion, not elsewhere classified: Secondary | ICD-10-CM | POA: Diagnosis not present

## 2022-06-30 DIAGNOSIS — R2681 Unsteadiness on feet: Secondary | ICD-10-CM | POA: Diagnosis not present

## 2022-06-30 DIAGNOSIS — R2689 Other abnormalities of gait and mobility: Secondary | ICD-10-CM | POA: Diagnosis not present

## 2022-06-30 DIAGNOSIS — D630 Anemia in neoplastic disease: Secondary | ICD-10-CM | POA: Diagnosis not present

## 2022-06-30 DIAGNOSIS — L89152 Pressure ulcer of sacral region, stage 2: Secondary | ICD-10-CM | POA: Diagnosis not present

## 2022-06-30 DIAGNOSIS — C251 Malignant neoplasm of body of pancreas: Secondary | ICD-10-CM | POA: Diagnosis not present

## 2022-06-30 DIAGNOSIS — E876 Hypokalemia: Secondary | ICD-10-CM | POA: Diagnosis not present

## 2022-06-30 DIAGNOSIS — C250 Malignant neoplasm of head of pancreas: Secondary | ICD-10-CM | POA: Diagnosis not present

## 2022-06-30 DIAGNOSIS — R9431 Abnormal electrocardiogram [ECG] [EKG]: Secondary | ICD-10-CM | POA: Diagnosis not present

## 2022-06-30 DIAGNOSIS — A09 Infectious gastroenteritis and colitis, unspecified: Secondary | ICD-10-CM | POA: Diagnosis not present

## 2022-06-30 DIAGNOSIS — C259 Malignant neoplasm of pancreas, unspecified: Secondary | ICD-10-CM | POA: Diagnosis not present

## 2022-06-30 DIAGNOSIS — K760 Fatty (change of) liver, not elsewhere classified: Secondary | ICD-10-CM | POA: Diagnosis not present

## 2022-06-30 DIAGNOSIS — R42 Dizziness and giddiness: Secondary | ICD-10-CM | POA: Diagnosis not present

## 2022-06-30 DIAGNOSIS — M810 Age-related osteoporosis without current pathological fracture: Secondary | ICD-10-CM | POA: Diagnosis not present

## 2022-06-30 DIAGNOSIS — I10 Essential (primary) hypertension: Secondary | ICD-10-CM | POA: Diagnosis not present

## 2022-06-30 DIAGNOSIS — R197 Diarrhea, unspecified: Secondary | ICD-10-CM | POA: Diagnosis not present

## 2022-06-30 DIAGNOSIS — R1030 Lower abdominal pain, unspecified: Secondary | ICD-10-CM | POA: Diagnosis not present

## 2022-06-30 DIAGNOSIS — A419 Sepsis, unspecified organism: Secondary | ICD-10-CM | POA: Diagnosis not present

## 2022-06-30 DIAGNOSIS — R000 Tachycardia, unspecified: Secondary | ICD-10-CM | POA: Diagnosis not present

## 2022-06-30 DIAGNOSIS — D638 Anemia in other chronic diseases classified elsewhere: Secondary | ICD-10-CM | POA: Diagnosis not present

## 2022-06-30 DIAGNOSIS — D649 Anemia, unspecified: Secondary | ICD-10-CM | POA: Diagnosis not present

## 2022-06-30 DIAGNOSIS — Z9221 Personal history of antineoplastic chemotherapy: Secondary | ICD-10-CM | POA: Diagnosis not present

## 2022-06-30 DIAGNOSIS — R531 Weakness: Secondary | ICD-10-CM | POA: Diagnosis not present

## 2022-06-30 DIAGNOSIS — E441 Mild protein-calorie malnutrition: Secondary | ICD-10-CM | POA: Diagnosis not present

## 2022-06-30 DIAGNOSIS — I959 Hypotension, unspecified: Secondary | ICD-10-CM | POA: Diagnosis not present

## 2022-06-30 DIAGNOSIS — E46 Unspecified protein-calorie malnutrition: Secondary | ICD-10-CM | POA: Diagnosis not present

## 2022-06-30 DIAGNOSIS — M6281 Muscle weakness (generalized): Secondary | ICD-10-CM | POA: Diagnosis not present

## 2022-06-30 DIAGNOSIS — R509 Fever, unspecified: Secondary | ICD-10-CM | POA: Diagnosis not present

## 2022-07-01 DIAGNOSIS — I4891 Unspecified atrial fibrillation: Secondary | ICD-10-CM | POA: Diagnosis not present

## 2022-07-01 DIAGNOSIS — D649 Anemia, unspecified: Secondary | ICD-10-CM | POA: Diagnosis not present

## 2022-07-01 DIAGNOSIS — C259 Malignant neoplasm of pancreas, unspecified: Secondary | ICD-10-CM | POA: Diagnosis not present

## 2022-07-01 DIAGNOSIS — E441 Mild protein-calorie malnutrition: Secondary | ICD-10-CM | POA: Diagnosis not present

## 2022-07-03 DIAGNOSIS — R197 Diarrhea, unspecified: Secondary | ICD-10-CM | POA: Diagnosis not present

## 2022-07-03 DIAGNOSIS — R531 Weakness: Secondary | ICD-10-CM | POA: Diagnosis not present

## 2022-07-03 DIAGNOSIS — D630 Anemia in neoplastic disease: Secondary | ICD-10-CM | POA: Diagnosis not present

## 2022-07-03 DIAGNOSIS — Z9221 Personal history of antineoplastic chemotherapy: Secondary | ICD-10-CM | POA: Diagnosis not present

## 2022-07-03 DIAGNOSIS — C259 Malignant neoplasm of pancreas, unspecified: Secondary | ICD-10-CM | POA: Diagnosis not present

## 2022-07-03 DIAGNOSIS — C250 Malignant neoplasm of head of pancreas: Secondary | ICD-10-CM | POA: Diagnosis not present

## 2022-07-03 DIAGNOSIS — J90 Pleural effusion, not elsewhere classified: Secondary | ICD-10-CM | POA: Diagnosis not present

## 2022-07-03 DIAGNOSIS — R188 Other ascites: Secondary | ICD-10-CM | POA: Diagnosis not present

## 2022-07-05 DIAGNOSIS — L89152 Pressure ulcer of sacral region, stage 2: Secondary | ICD-10-CM | POA: Diagnosis not present

## 2022-07-06 DIAGNOSIS — C259 Malignant neoplasm of pancreas, unspecified: Secondary | ICD-10-CM | POA: Diagnosis not present

## 2022-07-06 DIAGNOSIS — C250 Malignant neoplasm of head of pancreas: Secondary | ICD-10-CM | POA: Diagnosis not present

## 2022-07-06 DIAGNOSIS — D630 Anemia in neoplastic disease: Secondary | ICD-10-CM | POA: Diagnosis not present

## 2022-07-06 DIAGNOSIS — R197 Diarrhea, unspecified: Secondary | ICD-10-CM | POA: Diagnosis not present

## 2022-07-12 DIAGNOSIS — L89152 Pressure ulcer of sacral region, stage 2: Secondary | ICD-10-CM | POA: Diagnosis not present

## 2022-07-17 DIAGNOSIS — R6521 Severe sepsis with septic shock: Secondary | ICD-10-CM | POA: Diagnosis not present

## 2022-07-17 DIAGNOSIS — D6481 Anemia due to antineoplastic chemotherapy: Secondary | ICD-10-CM | POA: Diagnosis not present

## 2022-07-17 DIAGNOSIS — R9431 Abnormal electrocardiogram [ECG] [EKG]: Secondary | ICD-10-CM | POA: Diagnosis not present

## 2022-07-17 DIAGNOSIS — E871 Hypo-osmolality and hyponatremia: Secondary | ICD-10-CM | POA: Diagnosis not present

## 2022-07-17 DIAGNOSIS — T451X5A Adverse effect of antineoplastic and immunosuppressive drugs, initial encounter: Secondary | ICD-10-CM | POA: Diagnosis not present

## 2022-07-17 DIAGNOSIS — N179 Acute kidney failure, unspecified: Secondary | ICD-10-CM | POA: Diagnosis not present

## 2022-07-17 DIAGNOSIS — E119 Type 2 diabetes mellitus without complications: Secondary | ICD-10-CM | POA: Diagnosis not present

## 2022-07-17 DIAGNOSIS — Z792 Long term (current) use of antibiotics: Secondary | ICD-10-CM | POA: Diagnosis not present

## 2022-07-17 DIAGNOSIS — E876 Hypokalemia: Secondary | ICD-10-CM | POA: Diagnosis not present

## 2022-07-17 DIAGNOSIS — R5381 Other malaise: Secondary | ICD-10-CM | POA: Diagnosis not present

## 2022-07-17 DIAGNOSIS — Z7984 Long term (current) use of oral hypoglycemic drugs: Secondary | ICD-10-CM | POA: Diagnosis not present

## 2022-07-17 DIAGNOSIS — Z79899 Other long term (current) drug therapy: Secondary | ICD-10-CM | POA: Diagnosis not present

## 2022-07-17 DIAGNOSIS — R188 Other ascites: Secondary | ICD-10-CM | POA: Diagnosis not present

## 2022-07-17 DIAGNOSIS — I959 Hypotension, unspecified: Secondary | ICD-10-CM | POA: Diagnosis not present

## 2022-07-17 DIAGNOSIS — E872 Acidosis, unspecified: Secondary | ICD-10-CM | POA: Diagnosis not present

## 2022-07-17 DIAGNOSIS — I480 Paroxysmal atrial fibrillation: Secondary | ICD-10-CM | POA: Diagnosis not present

## 2022-07-17 DIAGNOSIS — J189 Pneumonia, unspecified organism: Secondary | ICD-10-CM | POA: Diagnosis not present

## 2022-07-17 DIAGNOSIS — Z7960 Long term (current) use of unspecified immunomodulators and immunosuppressants: Secondary | ICD-10-CM | POA: Diagnosis not present

## 2022-07-17 DIAGNOSIS — C259 Malignant neoplasm of pancreas, unspecified: Secondary | ICD-10-CM | POA: Diagnosis not present

## 2022-07-17 DIAGNOSIS — K760 Fatty (change of) liver, not elsewhere classified: Secondary | ICD-10-CM | POA: Diagnosis not present

## 2022-07-17 DIAGNOSIS — E8809 Other disorders of plasma-protein metabolism, not elsewhere classified: Secondary | ICD-10-CM | POA: Diagnosis not present

## 2022-07-17 DIAGNOSIS — Z1152 Encounter for screening for COVID-19: Secondary | ICD-10-CM | POA: Diagnosis not present

## 2022-07-17 DIAGNOSIS — Z7901 Long term (current) use of anticoagulants: Secondary | ICD-10-CM | POA: Diagnosis not present

## 2022-07-17 DIAGNOSIS — I4891 Unspecified atrial fibrillation: Secondary | ICD-10-CM | POA: Diagnosis not present

## 2022-07-17 DIAGNOSIS — A419 Sepsis, unspecified organism: Secondary | ICD-10-CM | POA: Diagnosis not present

## 2022-07-18 DIAGNOSIS — I4891 Unspecified atrial fibrillation: Secondary | ICD-10-CM | POA: Diagnosis not present

## 2022-07-21 DIAGNOSIS — I480 Paroxysmal atrial fibrillation: Secondary | ICD-10-CM | POA: Diagnosis not present

## 2022-08-12 DEATH — deceased
